# Patient Record
Sex: Male | Born: 1940
Health system: Southern US, Community
[De-identification: ages and names within clinical notes are randomized; demographics above are authoritative.]

## PROBLEM LIST (undated history)

## (undated) DIAGNOSIS — I10 Essential (primary) hypertension: Secondary | ICD-10-CM

## (undated) HISTORY — PX: KNEE ARTHROSCOPY: SUR90

## (undated) HISTORY — PX: SHOULDER ARTHROSCOPY: SHX128

---

## 1997-07-22 ENCOUNTER — Ambulatory Visit (HOSPITAL_COMMUNITY): Admission: RE | Admit: 1997-07-22 | Discharge: 1997-07-22 | Payer: Self-pay | Admitting: Cardiology

## 1997-08-28 ENCOUNTER — Ambulatory Visit (HOSPITAL_COMMUNITY): Admission: RE | Admit: 1997-08-28 | Discharge: 1997-08-28 | Payer: Self-pay | Admitting: Gastroenterology

## 1997-09-01 ENCOUNTER — Ambulatory Visit (HOSPITAL_BASED_OUTPATIENT_CLINIC_OR_DEPARTMENT_OTHER): Admission: RE | Admit: 1997-09-01 | Discharge: 1997-09-01 | Payer: Self-pay | Admitting: Urology

## 1998-10-27 ENCOUNTER — Emergency Department (HOSPITAL_COMMUNITY): Admission: EM | Admit: 1998-10-27 | Discharge: 1998-10-27 | Payer: Self-pay | Admitting: Emergency Medicine

## 1999-03-04 ENCOUNTER — Ambulatory Visit (HOSPITAL_COMMUNITY): Admission: RE | Admit: 1999-03-04 | Discharge: 1999-03-04 | Payer: Self-pay | Admitting: Cardiology

## 1999-03-04 ENCOUNTER — Encounter: Payer: Self-pay | Admitting: Cardiology

## 2002-08-07 ENCOUNTER — Encounter: Payer: Self-pay | Admitting: Emergency Medicine

## 2002-08-07 ENCOUNTER — Inpatient Hospital Stay (HOSPITAL_COMMUNITY): Admission: EM | Admit: 2002-08-07 | Discharge: 2002-08-08 | Payer: Self-pay | Admitting: Emergency Medicine

## 2002-08-13 ENCOUNTER — Ambulatory Visit (HOSPITAL_COMMUNITY): Admission: RE | Admit: 2002-08-13 | Discharge: 2002-08-13 | Payer: Self-pay | Admitting: Cardiology

## 2002-08-13 ENCOUNTER — Encounter: Payer: Self-pay | Admitting: Cardiology

## 2003-03-04 ENCOUNTER — Encounter: Admission: RE | Admit: 2003-03-04 | Discharge: 2003-03-04 | Payer: Self-pay | Admitting: Cardiology

## 2003-04-06 ENCOUNTER — Encounter: Admission: RE | Admit: 2003-04-06 | Discharge: 2003-04-06 | Payer: Self-pay | Admitting: Cardiology

## 2003-05-16 ENCOUNTER — Encounter: Admission: RE | Admit: 2003-05-16 | Discharge: 2003-05-16 | Payer: Self-pay | Admitting: Infectious Diseases

## 2005-06-14 ENCOUNTER — Emergency Department (HOSPITAL_COMMUNITY): Admission: EM | Admit: 2005-06-14 | Discharge: 2005-06-14 | Payer: Self-pay | Admitting: *Deleted

## 2009-09-03 ENCOUNTER — Encounter: Admission: RE | Admit: 2009-09-03 | Discharge: 2009-09-03 | Payer: Self-pay | Admitting: Cardiology

## 2010-05-05 ENCOUNTER — Other Ambulatory Visit: Payer: Self-pay | Admitting: Orthopedic Surgery

## 2010-05-05 DIAGNOSIS — M25511 Pain in right shoulder: Secondary | ICD-10-CM

## 2010-05-10 ENCOUNTER — Ambulatory Visit
Admission: RE | Admit: 2010-05-10 | Discharge: 2010-05-10 | Disposition: A | Discharge status: Home / Self Care | Payer: Medicare Other | Source: Ambulatory Visit | Attending: Orthopedic Surgery | Admitting: Orthopedic Surgery

## 2010-05-10 DIAGNOSIS — M25511 Pain in right shoulder: Secondary | ICD-10-CM

## 2010-05-21 ENCOUNTER — Encounter (HOSPITAL_BASED_OUTPATIENT_CLINIC_OR_DEPARTMENT_OTHER)
Admission: RE | Admit: 2010-05-21 | Discharge: 2010-05-21 | Disposition: A | Discharge status: Home / Self Care | Payer: Medicare Other | Source: Ambulatory Visit | Attending: Orthopedic Surgery | Admitting: Orthopedic Surgery

## 2010-05-21 DIAGNOSIS — Z01812 Encounter for preprocedural laboratory examination: Secondary | ICD-10-CM | POA: Insufficient documentation

## 2010-05-21 LAB — BASIC METABOLIC PANEL
GFR calc non Af Amer: >60 mL/min (ref >60)
Glucose, Bld: 88 mg/dL (ref 70–99)
Potassium: 4 mEq/L (ref 3.5–5.1)

## 2010-05-25 ENCOUNTER — Ambulatory Visit (HOSPITAL_BASED_OUTPATIENT_CLINIC_OR_DEPARTMENT_OTHER)
Admission: RE | Admit: 2010-05-25 | Discharge: 2010-05-26 | Disposition: A | Discharge status: Home / Self Care | Payer: Medicare Other | Source: Ambulatory Visit | Attending: Orthopedic Surgery | Admitting: Orthopedic Surgery

## 2010-05-25 DIAGNOSIS — I10 Essential (primary) hypertension: Secondary | ICD-10-CM | POA: Insufficient documentation

## 2010-05-25 DIAGNOSIS — M19019 Primary osteoarthritis, unspecified shoulder: Secondary | ICD-10-CM | POA: Insufficient documentation

## 2010-05-25 DIAGNOSIS — Z01812 Encounter for preprocedural laboratory examination: Secondary | ICD-10-CM | POA: Insufficient documentation

## 2010-05-25 DIAGNOSIS — M24119 Other articular cartilage disorders, unspecified shoulder: Secondary | ICD-10-CM | POA: Insufficient documentation

## 2010-05-25 DIAGNOSIS — M719 Bursopathy, unspecified: Secondary | ICD-10-CM | POA: Insufficient documentation

## 2010-05-25 DIAGNOSIS — M25819 Other specified joint disorders, unspecified shoulder: Secondary | ICD-10-CM | POA: Insufficient documentation

## 2010-05-25 DIAGNOSIS — M67919 Unspecified disorder of synovium and tendon, unspecified shoulder: Secondary | ICD-10-CM | POA: Insufficient documentation

## 2010-05-27 NOTE — Op Note (Signed)
NAME:  Theodore Wilson, Theodore Wilson                 ACCOUNT NO.:  [id number]  MEDICAL RECORD NO.:  [id number]          PATIENT TYPE:  LOCATION:                                 FACILITY:  PHYSICIAN:  Elana Alm. Thurston Hole, M.D. DATE OF BIRTH:  Jan 06, 1941  DATE OF PROCEDURE:  05/25/2010 DATE OF DISCHARGE:                              OPERATIVE REPORT   PREOPERATIVE DIAGNOSES: 1. Right shoulder rotator cuff tear. 2. Right shoulder partial labrum tear. 3. Right shoulder impingement. 4. 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  POSTOPERATIVE DIAGNOSES: 1. Right shoulder rotator cuff tear. 2. Right shoulder partial labrum tear. 3. Right shoulder impingement. 4. 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  PROCEDURES: 1. Right shoulder EUA followed by arthroscopically assisted rotator     cuff repair using Arthrex suture anchor x1 plus swivel lock anchor     x1. 2. Right shoulder debridement partial labrum tear. 3. Right shoulder subacromial decompression. 4. Right shoulder distal clavicle excision.  SURGEON:  Elana Alm. Thurston Hole, MD  ASSISTANT:  Julien Girt, PA-C  ANESTHESIA:  General.  OPERATIVE TIME:  1 hour and 15 minutes.  COMPLICATIONS:  None.  INDICATIONS FOR PROCEDURE:  Theodore Wilson is a 70 year old gentleman who has had 2-3 years of increasing right shoulder pain with exam and MRI documenting a rotator cuff tear, partial labrum tear with impingement, and AC joint arthropathy.  He has failed conservative care and is now to undergo arthroscopy and repair.  DESCRIPTION:  Theodore Wilson is brought to the operating room on May 25, 2010, after an interscalene block was placed in the holding room by Anesthesia.  He was placed on the operative table in a supine position. After being placed under general anesthesia, his right shoulder was examined.  He had full range of motion in his shoulder with stable ligamentous exam.  He received Ancef 1 g IV  preoperatively for prophylaxis.  He was then placed in beach-chair position and shoulder and arm was prepped using sterile DuraPrep and draped using sterile technique.  Time-out procedure was called and the correct right shoulder identified.  Initially through a posterior arthroscopic portal, the arthroscope with a pump attached was placed into an anterior portal and arthroscopic probe was placed.  On initial inspection, the articular cartilage in the glenohumeral joint was found to be intact.  He had partial tearing of the anterior and superior labrum 25%, which was debrided.  The anterior-inferior labrum and anterior-inferior glenohumeral ligament complex was intact.  Biceps tendon anchor and biceps tendon was intact.  The rotator cuff showed a complete tear of the supraspinatus and partial tear of the infraspinatus, the anterior 50%, this was debrided arthroscopically.  The rest of the rotator cuff was intact.  Inferior capsular recess was free of pathology. Subacromial space was entered and a lateral arthroscopic portal was made.  Moderately thickened bursitis was resected.  The rotator cuff tear was further debrided arthroscopically.  Impingement was noted.  A subacromial decompression was carried out removing 6 to 8 mm of the undersurface of the anterolateral and anteromedial acromion and CA ligament release carried out  as well.  The [facility] joint showed significant spurring and degenerative changes and the distal 8-10 mm of clavicle was resected with a 6-mm bur.  After this was done, then 2 accessory lateral portals, the rotator cuff tear was repaired primarily with one Arthrex suture anchoring the greater tuberosity.  Each of the sutures in this anchor were passed arthroscopically with a mattress suture technique and then the central portion had a suture link placed through as well and then all portions of these sutures after the mattress sutures were tied down arthroscopically.  The  remaining ends of all these sutures were passed through a swivel lock and then this was placed in the lateral aspect of the greater tuberosity with secondary firm and tight fixation as well.  This completely repaired the rotator cuff tear back down to the greater tuberosity with firm and tight fixation in two different places.  After this was done, the shoulder could be brought through a satisfactory range of motion with no impingement on the repair.  At this point, I felt that all pathology had been satisfactorily addressed.  The instruments were removed.  Portal was closed with 3-0 nylon suture. Sterile dressings and a sling applied and the patient awakened and taken to recovery room in a stable condition.  FOLLOWUP CARE:  Theodore Wilson will be followed overnight at recovery care center for IV pain control, neurovascular monitoring.  He will be discharged tomorrow on Percocet and Robaxin.  Begin early physical therapy.  He will be seen back in the office in a week for sutures out and followup.     Tailyn Hantz A. Thurston Hole, M.D.     RAW/MEDQ  D:  05/25/2010  T:  05/26/2010  Job:  [id number]  Electronically Signed by Salvatore Marvel M.D. on 05/26/2010 02:22:54 PM

## 2010-08-06 NOTE — Discharge Summary (Signed)
   NAME:  Theodore Wilson, Theodore Wilson NO.:  [id number]   MEDICAL RECORD NO.:  [id number]                   PATIENT TYPE:  INP   LOCATION:  2008                                 FACILITY:  [facility]   PHYSICIAN:  Osvaldo Shipper. Spruill, M.D.             DATE OF BIRTH:  [birth year]   DATE OF ADMISSION:  08/07/2002  DATE OF DISCHARGE:  08/08/2002                                 DISCHARGE SUMMARY   DISCHARGE DIAGNOSES:  1. Chest pain.  2. Hypertension.  3. Hyperlipidemia.   HISTORY OF PRESENT ILLNESS:  This is a 70 year old male who presents  initially to the emergency department of [facility] after having  had pain initially in his back and then noted pressure in the left  substernal area radiating down the left arm.  He complained of being weak  and having shortness of breath.   HOSPITAL COURSE:  The patient was admitted to the medical service.  He was  placed on telemetry.  He was seen by pharmacy for Lovenox protocol.  He  received O2 therapy, Toprol XL 50, aspirin therapy, Diovan 160, Ativan 1 mg  three times daily and Protonix 40 mg.  On Aug 08, 2002, the patient's chest  pain was better.  His chest x-ray was normal.  His repeat electrocardiogram  was nonacute.  His CKs were elevated, but the MBs were well within normal  limits.  The troponins were also well within normal limits.  His cholesterol  was normal at 196.  It was therefore the opinion the patient could be  discharged home and aggressive followup done as an outpatient.   FOLLOW UP:  The patient is to be followed in the office this week for  aggressive treatment and continued workup of the chest pain and discomfort.  He will have an outpatient Persantine Cardiolite study on Aug 13, 2002.  He  is to notify the office immediately if any changes, problems or concerns.   DISCHARGE MEDICATIONS:  Resume home medications.       Ivery Quale, P.A.                       Osvaldo Shipper. Spruill, M.D.    HB/MEDQ  D:  08/29/2002  T:  08/30/2002  Job:  [id number]

## 2011-07-04 DIAGNOSIS — R0602 Shortness of breath: Secondary | ICD-10-CM | POA: Diagnosis not present

## 2011-07-04 DIAGNOSIS — E119 Type 2 diabetes mellitus without complications: Secondary | ICD-10-CM | POA: Diagnosis not present

## 2011-07-04 DIAGNOSIS — E559 Vitamin D deficiency, unspecified: Secondary | ICD-10-CM | POA: Diagnosis not present

## 2011-07-08 DIAGNOSIS — E291 Testicular hypofunction: Secondary | ICD-10-CM | POA: Diagnosis not present

## 2011-07-08 DIAGNOSIS — N138 Other obstructive and reflux uropathy: Secondary | ICD-10-CM | POA: Diagnosis not present

## 2011-07-08 DIAGNOSIS — N401 Enlarged prostate with lower urinary tract symptoms: Secondary | ICD-10-CM | POA: Diagnosis not present

## 2011-07-08 DIAGNOSIS — N139 Obstructive and reflux uropathy, unspecified: Secondary | ICD-10-CM | POA: Diagnosis not present

## 2011-07-29 DIAGNOSIS — N139 Obstructive and reflux uropathy, unspecified: Secondary | ICD-10-CM | POA: Diagnosis not present

## 2011-07-29 DIAGNOSIS — N401 Enlarged prostate with lower urinary tract symptoms: Secondary | ICD-10-CM | POA: Diagnosis not present

## 2011-08-10 DIAGNOSIS — M999 Biomechanical lesion, unspecified: Secondary | ICD-10-CM | POA: Diagnosis not present

## 2011-08-10 DIAGNOSIS — M25559 Pain in unspecified hip: Secondary | ICD-10-CM | POA: Diagnosis not present

## 2011-08-10 DIAGNOSIS — M5126 Other intervertebral disc displacement, lumbar region: Secondary | ICD-10-CM | POA: Diagnosis not present

## 2011-11-03 DIAGNOSIS — E291 Testicular hypofunction: Secondary | ICD-10-CM | POA: Diagnosis not present

## 2011-11-03 DIAGNOSIS — R3129 Other microscopic hematuria: Secondary | ICD-10-CM | POA: Diagnosis not present

## 2011-11-03 DIAGNOSIS — R823 Hemoglobinuria: Secondary | ICD-10-CM | POA: Diagnosis not present

## 2011-11-24 DIAGNOSIS — R823 Hemoglobinuria: Secondary | ICD-10-CM | POA: Diagnosis not present

## 2011-11-24 DIAGNOSIS — R3129 Other microscopic hematuria: Secondary | ICD-10-CM | POA: Diagnosis not present

## 2011-12-02 DIAGNOSIS — R3129 Other microscopic hematuria: Secondary | ICD-10-CM | POA: Diagnosis not present

## 2011-12-02 DIAGNOSIS — R319 Hematuria, unspecified: Secondary | ICD-10-CM | POA: Diagnosis not present

## 2012-01-05 DIAGNOSIS — R3129 Other microscopic hematuria: Secondary | ICD-10-CM | POA: Diagnosis not present

## 2012-01-09 DIAGNOSIS — I10 Essential (primary) hypertension: Secondary | ICD-10-CM | POA: Diagnosis not present

## 2012-01-09 DIAGNOSIS — M1990 Unspecified osteoarthritis, unspecified site: Secondary | ICD-10-CM | POA: Diagnosis not present

## 2012-01-09 DIAGNOSIS — R7309 Other abnormal glucose: Secondary | ICD-10-CM | POA: Diagnosis not present

## 2012-01-09 DIAGNOSIS — E119 Type 2 diabetes mellitus without complications: Secondary | ICD-10-CM | POA: Diagnosis not present

## 2012-01-09 DIAGNOSIS — Z23 Encounter for immunization: Secondary | ICD-10-CM | POA: Diagnosis not present

## 2012-01-13 DIAGNOSIS — Z23 Encounter for immunization: Secondary | ICD-10-CM | POA: Diagnosis not present

## 2012-02-20 DIAGNOSIS — E291 Testicular hypofunction: Secondary | ICD-10-CM | POA: Diagnosis not present

## 2012-03-06 DIAGNOSIS — E291 Testicular hypofunction: Secondary | ICD-10-CM | POA: Diagnosis not present

## 2012-03-06 DIAGNOSIS — N139 Obstructive and reflux uropathy, unspecified: Secondary | ICD-10-CM | POA: Diagnosis not present

## 2012-03-06 DIAGNOSIS — N401 Enlarged prostate with lower urinary tract symptoms: Secondary | ICD-10-CM | POA: Diagnosis not present

## 2012-03-06 DIAGNOSIS — T50995A Adverse effect of other drugs, medicaments and biological substances, initial encounter: Secondary | ICD-10-CM | POA: Diagnosis not present

## 2012-03-06 DIAGNOSIS — R358 Other polyuria: Secondary | ICD-10-CM | POA: Diagnosis not present

## 2012-05-01 DIAGNOSIS — E291 Testicular hypofunction: Secondary | ICD-10-CM | POA: Diagnosis not present

## 2012-06-14 DIAGNOSIS — N401 Enlarged prostate with lower urinary tract symptoms: Secondary | ICD-10-CM | POA: Diagnosis not present

## 2012-06-14 DIAGNOSIS — N139 Obstructive and reflux uropathy, unspecified: Secondary | ICD-10-CM | POA: Diagnosis not present

## 2012-06-21 DIAGNOSIS — E291 Testicular hypofunction: Secondary | ICD-10-CM | POA: Diagnosis not present

## 2012-07-03 DIAGNOSIS — E119 Type 2 diabetes mellitus without complications: Secondary | ICD-10-CM | POA: Diagnosis not present

## 2012-07-05 DIAGNOSIS — M79609 Pain in unspecified limb: Secondary | ICD-10-CM | POA: Diagnosis not present

## 2012-07-16 DIAGNOSIS — M5430 Sciatica, unspecified side: Secondary | ICD-10-CM | POA: Diagnosis not present

## 2012-07-19 DIAGNOSIS — E290 Testicular hyperfunction: Secondary | ICD-10-CM | POA: Diagnosis not present

## 2012-07-26 ENCOUNTER — Other Ambulatory Visit: Payer: Self-pay | Admitting: Orthopedic Surgery

## 2012-07-26 ENCOUNTER — Ambulatory Visit
Admission: RE | Admit: 2012-07-26 | Discharge: 2012-07-26 | Disposition: A | Discharge status: Home / Self Care | Payer: Medicare Other | Source: Ambulatory Visit | Attending: Orthopedic Surgery | Admitting: Orthopedic Surgery

## 2012-07-26 DIAGNOSIS — M47817 Spondylosis without myelopathy or radiculopathy, lumbosacral region: Secondary | ICD-10-CM | POA: Diagnosis not present

## 2012-07-26 DIAGNOSIS — M5430 Sciatica, unspecified side: Secondary | ICD-10-CM

## 2012-07-26 DIAGNOSIS — M5137 Other intervertebral disc degeneration, lumbosacral region: Secondary | ICD-10-CM | POA: Diagnosis not present

## 2012-07-30 DIAGNOSIS — M5430 Sciatica, unspecified side: Secondary | ICD-10-CM | POA: Diagnosis not present

## 2012-08-17 DIAGNOSIS — N139 Obstructive and reflux uropathy, unspecified: Secondary | ICD-10-CM | POA: Diagnosis not present

## 2012-08-17 DIAGNOSIS — E291 Testicular hypofunction: Secondary | ICD-10-CM | POA: Diagnosis not present

## 2012-08-17 DIAGNOSIS — N401 Enlarged prostate with lower urinary tract symptoms: Secondary | ICD-10-CM | POA: Diagnosis not present

## 2012-08-20 DIAGNOSIS — M5137 Other intervertebral disc degeneration, lumbosacral region: Secondary | ICD-10-CM | POA: Diagnosis not present

## 2012-09-06 DIAGNOSIS — I10 Essential (primary) hypertension: Secondary | ICD-10-CM | POA: Diagnosis not present

## 2012-09-27 DIAGNOSIS — E291 Testicular hypofunction: Secondary | ICD-10-CM | POA: Diagnosis not present

## 2012-11-02 DIAGNOSIS — E7800 Pure hypercholesterolemia, unspecified: Secondary | ICD-10-CM | POA: Diagnosis not present

## 2012-11-02 DIAGNOSIS — R7309 Other abnormal glucose: Secondary | ICD-10-CM | POA: Diagnosis not present

## 2012-11-02 DIAGNOSIS — I10 Essential (primary) hypertension: Secondary | ICD-10-CM | POA: Diagnosis not present

## 2012-11-29 DIAGNOSIS — E291 Testicular hypofunction: Secondary | ICD-10-CM | POA: Diagnosis not present

## 2012-11-29 DIAGNOSIS — N401 Enlarged prostate with lower urinary tract symptoms: Secondary | ICD-10-CM | POA: Diagnosis not present

## 2012-11-29 DIAGNOSIS — N139 Obstructive and reflux uropathy, unspecified: Secondary | ICD-10-CM | POA: Diagnosis not present

## 2012-12-24 DIAGNOSIS — G5780 Other specified mononeuropathies of unspecified lower limb: Secondary | ICD-10-CM | POA: Diagnosis not present

## 2013-02-19 DIAGNOSIS — N401 Enlarged prostate with lower urinary tract symptoms: Secondary | ICD-10-CM | POA: Diagnosis not present

## 2013-02-19 DIAGNOSIS — N139 Obstructive and reflux uropathy, unspecified: Secondary | ICD-10-CM | POA: Diagnosis not present

## 2013-02-19 DIAGNOSIS — E291 Testicular hypofunction: Secondary | ICD-10-CM | POA: Diagnosis not present

## 2013-02-25 DIAGNOSIS — M5137 Other intervertebral disc degeneration, lumbosacral region: Secondary | ICD-10-CM | POA: Diagnosis not present

## 2013-03-04 DIAGNOSIS — I10 Essential (primary) hypertension: Secondary | ICD-10-CM | POA: Diagnosis not present

## 2013-03-26 DIAGNOSIS — N139 Obstructive and reflux uropathy, unspecified: Secondary | ICD-10-CM | POA: Diagnosis not present

## 2013-03-26 DIAGNOSIS — R358 Other polyuria: Secondary | ICD-10-CM | POA: Diagnosis not present

## 2013-03-26 DIAGNOSIS — N401 Enlarged prostate with lower urinary tract symptoms: Secondary | ICD-10-CM | POA: Diagnosis not present

## 2013-03-26 DIAGNOSIS — T50995A Adverse effect of other drugs, medicaments and biological substances, initial encounter: Secondary | ICD-10-CM | POA: Diagnosis not present

## 2013-03-26 DIAGNOSIS — R3589 Other polyuria: Secondary | ICD-10-CM | POA: Diagnosis not present

## 2013-03-26 DIAGNOSIS — N138 Other obstructive and reflux uropathy: Secondary | ICD-10-CM | POA: Diagnosis not present

## 2013-03-26 DIAGNOSIS — E291 Testicular hypofunction: Secondary | ICD-10-CM | POA: Diagnosis not present

## 2013-04-29 DIAGNOSIS — M5137 Other intervertebral disc degeneration, lumbosacral region: Secondary | ICD-10-CM | POA: Diagnosis not present

## 2013-05-21 DIAGNOSIS — N401 Enlarged prostate with lower urinary tract symptoms: Secondary | ICD-10-CM | POA: Diagnosis not present

## 2013-05-21 DIAGNOSIS — T50995A Adverse effect of other drugs, medicaments and biological substances, initial encounter: Secondary | ICD-10-CM | POA: Diagnosis not present

## 2013-05-21 DIAGNOSIS — E291 Testicular hypofunction: Secondary | ICD-10-CM | POA: Diagnosis not present

## 2013-05-21 DIAGNOSIS — R358 Other polyuria: Secondary | ICD-10-CM | POA: Diagnosis not present

## 2013-05-21 DIAGNOSIS — N138 Other obstructive and reflux uropathy: Secondary | ICD-10-CM | POA: Diagnosis not present

## 2013-05-21 DIAGNOSIS — R3589 Other polyuria: Secondary | ICD-10-CM | POA: Diagnosis not present

## 2013-05-21 DIAGNOSIS — N139 Obstructive and reflux uropathy, unspecified: Secondary | ICD-10-CM | POA: Diagnosis not present

## 2013-07-01 DIAGNOSIS — E119 Type 2 diabetes mellitus without complications: Secondary | ICD-10-CM | POA: Diagnosis not present

## 2013-07-02 DIAGNOSIS — I10 Essential (primary) hypertension: Secondary | ICD-10-CM | POA: Diagnosis not present

## 2013-07-02 DIAGNOSIS — R7309 Other abnormal glucose: Secondary | ICD-10-CM | POA: Diagnosis not present

## 2013-07-23 DIAGNOSIS — R358 Other polyuria: Secondary | ICD-10-CM | POA: Diagnosis not present

## 2013-07-23 DIAGNOSIS — N138 Other obstructive and reflux uropathy: Secondary | ICD-10-CM | POA: Diagnosis not present

## 2013-07-23 DIAGNOSIS — T50995A Adverse effect of other drugs, medicaments and biological substances, initial encounter: Secondary | ICD-10-CM | POA: Diagnosis not present

## 2013-07-23 DIAGNOSIS — N139 Obstructive and reflux uropathy, unspecified: Secondary | ICD-10-CM | POA: Diagnosis not present

## 2013-07-23 DIAGNOSIS — R3589 Other polyuria: Secondary | ICD-10-CM | POA: Diagnosis not present

## 2013-07-23 DIAGNOSIS — E291 Testicular hypofunction: Secondary | ICD-10-CM | POA: Diagnosis not present

## 2013-07-23 DIAGNOSIS — N401 Enlarged prostate with lower urinary tract symptoms: Secondary | ICD-10-CM | POA: Diagnosis not present

## 2013-07-23 DIAGNOSIS — N486 Induration penis plastica: Secondary | ICD-10-CM | POA: Diagnosis not present

## 2013-09-05 DIAGNOSIS — N139 Obstructive and reflux uropathy, unspecified: Secondary | ICD-10-CM | POA: Diagnosis not present

## 2013-09-05 DIAGNOSIS — N138 Other obstructive and reflux uropathy: Secondary | ICD-10-CM | POA: Diagnosis not present

## 2013-09-05 DIAGNOSIS — E291 Testicular hypofunction: Secondary | ICD-10-CM | POA: Diagnosis not present

## 2013-09-05 DIAGNOSIS — N401 Enlarged prostate with lower urinary tract symptoms: Secondary | ICD-10-CM | POA: Diagnosis not present

## 2013-09-16 DIAGNOSIS — M5137 Other intervertebral disc degeneration, lumbosacral region: Secondary | ICD-10-CM | POA: Diagnosis not present

## 2013-09-20 DIAGNOSIS — H8309 Labyrinthitis, unspecified ear: Secondary | ICD-10-CM | POA: Diagnosis not present

## 2013-09-20 DIAGNOSIS — I10 Essential (primary) hypertension: Secondary | ICD-10-CM | POA: Diagnosis not present

## 2013-09-20 DIAGNOSIS — E119 Type 2 diabetes mellitus without complications: Secondary | ICD-10-CM | POA: Diagnosis not present

## 2013-10-07 DIAGNOSIS — H8309 Labyrinthitis, unspecified ear: Secondary | ICD-10-CM | POA: Diagnosis not present

## 2013-10-07 DIAGNOSIS — I10 Essential (primary) hypertension: Secondary | ICD-10-CM | POA: Diagnosis not present

## 2013-10-10 DIAGNOSIS — R358 Other polyuria: Secondary | ICD-10-CM | POA: Diagnosis not present

## 2013-10-10 DIAGNOSIS — T50995A Adverse effect of other drugs, medicaments and biological substances, initial encounter: Secondary | ICD-10-CM | POA: Diagnosis not present

## 2013-10-10 DIAGNOSIS — N401 Enlarged prostate with lower urinary tract symptoms: Secondary | ICD-10-CM | POA: Diagnosis not present

## 2013-10-10 DIAGNOSIS — N139 Obstructive and reflux uropathy, unspecified: Secondary | ICD-10-CM | POA: Diagnosis not present

## 2013-10-10 DIAGNOSIS — R3589 Other polyuria: Secondary | ICD-10-CM | POA: Diagnosis not present

## 2013-10-10 DIAGNOSIS — E291 Testicular hypofunction: Secondary | ICD-10-CM | POA: Diagnosis not present

## 2013-11-13 DIAGNOSIS — M545 Low back pain, unspecified: Secondary | ICD-10-CM | POA: Diagnosis not present

## 2013-11-13 DIAGNOSIS — IMO0001 Reserved for inherently not codable concepts without codable children: Secondary | ICD-10-CM | POA: Diagnosis not present

## 2013-11-13 DIAGNOSIS — M25519 Pain in unspecified shoulder: Secondary | ICD-10-CM | POA: Diagnosis not present

## 2013-11-13 DIAGNOSIS — M4310 Spondylolisthesis, site unspecified: Secondary | ICD-10-CM | POA: Diagnosis not present

## 2013-11-13 DIAGNOSIS — M25569 Pain in unspecified knee: Secondary | ICD-10-CM | POA: Diagnosis not present

## 2013-11-13 DIAGNOSIS — IMO0002 Reserved for concepts with insufficient information to code with codable children: Secondary | ICD-10-CM | POA: Diagnosis not present

## 2013-11-13 DIAGNOSIS — M1710 Unilateral primary osteoarthritis, unspecified knee: Secondary | ICD-10-CM | POA: Diagnosis not present

## 2013-12-03 DIAGNOSIS — M4310 Spondylolisthesis, site unspecified: Secondary | ICD-10-CM | POA: Diagnosis not present

## 2013-12-03 DIAGNOSIS — M159 Polyosteoarthritis, unspecified: Secondary | ICD-10-CM | POA: Diagnosis not present

## 2013-12-03 DIAGNOSIS — M25519 Pain in unspecified shoulder: Secondary | ICD-10-CM | POA: Diagnosis not present

## 2013-12-03 DIAGNOSIS — M4600 Spinal enthesopathy, site unspecified: Secondary | ICD-10-CM | POA: Diagnosis not present

## 2013-12-03 DIAGNOSIS — M76899 Other specified enthesopathies of unspecified lower limb, excluding foot: Secondary | ICD-10-CM | POA: Diagnosis not present

## 2013-12-03 DIAGNOSIS — M545 Low back pain, unspecified: Secondary | ICD-10-CM | POA: Diagnosis not present

## 2013-12-03 DIAGNOSIS — IMO0002 Reserved for concepts with insufficient information to code with codable children: Secondary | ICD-10-CM | POA: Diagnosis not present

## 2013-12-03 DIAGNOSIS — M25569 Pain in unspecified knee: Secondary | ICD-10-CM | POA: Diagnosis not present

## 2013-12-03 DIAGNOSIS — IMO0001 Reserved for inherently not codable concepts without codable children: Secondary | ICD-10-CM | POA: Diagnosis not present

## 2013-12-03 DIAGNOSIS — M999 Biomechanical lesion, unspecified: Secondary | ICD-10-CM | POA: Diagnosis not present

## 2013-12-03 DIAGNOSIS — M1710 Unilateral primary osteoarthritis, unspecified knee: Secondary | ICD-10-CM | POA: Diagnosis not present

## 2013-12-03 DIAGNOSIS — M5137 Other intervertebral disc degeneration, lumbosacral region: Secondary | ICD-10-CM | POA: Diagnosis not present

## 2013-12-04 DIAGNOSIS — IMO0001 Reserved for inherently not codable concepts without codable children: Secondary | ICD-10-CM | POA: Diagnosis not present

## 2013-12-04 DIAGNOSIS — M545 Low back pain, unspecified: Secondary | ICD-10-CM | POA: Diagnosis not present

## 2013-12-04 DIAGNOSIS — M159 Polyosteoarthritis, unspecified: Secondary | ICD-10-CM | POA: Diagnosis not present

## 2013-12-04 DIAGNOSIS — M25519 Pain in unspecified shoulder: Secondary | ICD-10-CM | POA: Diagnosis not present

## 2013-12-04 DIAGNOSIS — M999 Biomechanical lesion, unspecified: Secondary | ICD-10-CM | POA: Diagnosis not present

## 2013-12-04 DIAGNOSIS — M76899 Other specified enthesopathies of unspecified lower limb, excluding foot: Secondary | ICD-10-CM | POA: Diagnosis not present

## 2013-12-04 DIAGNOSIS — M4600 Spinal enthesopathy, site unspecified: Secondary | ICD-10-CM | POA: Diagnosis not present

## 2013-12-04 DIAGNOSIS — M4310 Spondylolisthesis, site unspecified: Secondary | ICD-10-CM | POA: Diagnosis not present

## 2013-12-04 DIAGNOSIS — M1710 Unilateral primary osteoarthritis, unspecified knee: Secondary | ICD-10-CM | POA: Diagnosis not present

## 2013-12-04 DIAGNOSIS — M5137 Other intervertebral disc degeneration, lumbosacral region: Secondary | ICD-10-CM | POA: Diagnosis not present

## 2013-12-04 DIAGNOSIS — M25569 Pain in unspecified knee: Secondary | ICD-10-CM | POA: Diagnosis not present

## 2013-12-04 DIAGNOSIS — IMO0002 Reserved for concepts with insufficient information to code with codable children: Secondary | ICD-10-CM | POA: Diagnosis not present

## 2013-12-05 DIAGNOSIS — M159 Polyosteoarthritis, unspecified: Secondary | ICD-10-CM | POA: Diagnosis not present

## 2013-12-05 DIAGNOSIS — M25519 Pain in unspecified shoulder: Secondary | ICD-10-CM | POA: Diagnosis not present

## 2013-12-05 DIAGNOSIS — M4600 Spinal enthesopathy, site unspecified: Secondary | ICD-10-CM | POA: Diagnosis not present

## 2013-12-05 DIAGNOSIS — M545 Low back pain, unspecified: Secondary | ICD-10-CM | POA: Diagnosis not present

## 2013-12-05 DIAGNOSIS — M25569 Pain in unspecified knee: Secondary | ICD-10-CM | POA: Diagnosis not present

## 2013-12-05 DIAGNOSIS — M999 Biomechanical lesion, unspecified: Secondary | ICD-10-CM | POA: Diagnosis not present

## 2013-12-05 DIAGNOSIS — IMO0002 Reserved for concepts with insufficient information to code with codable children: Secondary | ICD-10-CM | POA: Diagnosis not present

## 2013-12-05 DIAGNOSIS — M76899 Other specified enthesopathies of unspecified lower limb, excluding foot: Secondary | ICD-10-CM | POA: Diagnosis not present

## 2013-12-05 DIAGNOSIS — IMO0001 Reserved for inherently not codable concepts without codable children: Secondary | ICD-10-CM | POA: Diagnosis not present

## 2013-12-05 DIAGNOSIS — M1710 Unilateral primary osteoarthritis, unspecified knee: Secondary | ICD-10-CM | POA: Diagnosis not present

## 2013-12-05 DIAGNOSIS — M5137 Other intervertebral disc degeneration, lumbosacral region: Secondary | ICD-10-CM | POA: Diagnosis not present

## 2013-12-05 DIAGNOSIS — M4310 Spondylolisthesis, site unspecified: Secondary | ICD-10-CM | POA: Diagnosis not present

## 2013-12-10 DIAGNOSIS — M545 Low back pain, unspecified: Secondary | ICD-10-CM | POA: Diagnosis not present

## 2013-12-10 DIAGNOSIS — IMO0001 Reserved for inherently not codable concepts without codable children: Secondary | ICD-10-CM | POA: Diagnosis not present

## 2013-12-10 DIAGNOSIS — M999 Biomechanical lesion, unspecified: Secondary | ICD-10-CM | POA: Diagnosis not present

## 2013-12-10 DIAGNOSIS — M76899 Other specified enthesopathies of unspecified lower limb, excluding foot: Secondary | ICD-10-CM | POA: Diagnosis not present

## 2013-12-10 DIAGNOSIS — M25519 Pain in unspecified shoulder: Secondary | ICD-10-CM | POA: Diagnosis not present

## 2013-12-10 DIAGNOSIS — M4600 Spinal enthesopathy, site unspecified: Secondary | ICD-10-CM | POA: Diagnosis not present

## 2013-12-10 DIAGNOSIS — IMO0002 Reserved for concepts with insufficient information to code with codable children: Secondary | ICD-10-CM | POA: Diagnosis not present

## 2013-12-10 DIAGNOSIS — M159 Polyosteoarthritis, unspecified: Secondary | ICD-10-CM | POA: Diagnosis not present

## 2013-12-10 DIAGNOSIS — M4310 Spondylolisthesis, site unspecified: Secondary | ICD-10-CM | POA: Diagnosis not present

## 2013-12-10 DIAGNOSIS — M5137 Other intervertebral disc degeneration, lumbosacral region: Secondary | ICD-10-CM | POA: Diagnosis not present

## 2013-12-10 DIAGNOSIS — M25569 Pain in unspecified knee: Secondary | ICD-10-CM | POA: Diagnosis not present

## 2013-12-10 DIAGNOSIS — M1710 Unilateral primary osteoarthritis, unspecified knee: Secondary | ICD-10-CM | POA: Diagnosis not present

## 2013-12-11 DIAGNOSIS — M159 Polyosteoarthritis, unspecified: Secondary | ICD-10-CM | POA: Diagnosis not present

## 2013-12-11 DIAGNOSIS — M545 Low back pain, unspecified: Secondary | ICD-10-CM | POA: Diagnosis not present

## 2013-12-11 DIAGNOSIS — M4600 Spinal enthesopathy, site unspecified: Secondary | ICD-10-CM | POA: Diagnosis not present

## 2013-12-11 DIAGNOSIS — M76899 Other specified enthesopathies of unspecified lower limb, excluding foot: Secondary | ICD-10-CM | POA: Diagnosis not present

## 2013-12-11 DIAGNOSIS — M1710 Unilateral primary osteoarthritis, unspecified knee: Secondary | ICD-10-CM | POA: Diagnosis not present

## 2013-12-11 DIAGNOSIS — M25569 Pain in unspecified knee: Secondary | ICD-10-CM | POA: Diagnosis not present

## 2013-12-11 DIAGNOSIS — M4310 Spondylolisthesis, site unspecified: Secondary | ICD-10-CM | POA: Diagnosis not present

## 2013-12-11 DIAGNOSIS — M25519 Pain in unspecified shoulder: Secondary | ICD-10-CM | POA: Diagnosis not present

## 2013-12-11 DIAGNOSIS — IMO0001 Reserved for inherently not codable concepts without codable children: Secondary | ICD-10-CM | POA: Diagnosis not present

## 2013-12-11 DIAGNOSIS — IMO0002 Reserved for concepts with insufficient information to code with codable children: Secondary | ICD-10-CM | POA: Diagnosis not present

## 2013-12-12 DIAGNOSIS — M545 Low back pain, unspecified: Secondary | ICD-10-CM | POA: Diagnosis not present

## 2013-12-12 DIAGNOSIS — IMO0002 Reserved for concepts with insufficient information to code with codable children: Secondary | ICD-10-CM | POA: Diagnosis not present

## 2013-12-12 DIAGNOSIS — M25569 Pain in unspecified knee: Secondary | ICD-10-CM | POA: Diagnosis not present

## 2013-12-12 DIAGNOSIS — M1710 Unilateral primary osteoarthritis, unspecified knee: Secondary | ICD-10-CM | POA: Diagnosis not present

## 2013-12-12 DIAGNOSIS — M159 Polyosteoarthritis, unspecified: Secondary | ICD-10-CM | POA: Diagnosis not present

## 2013-12-12 DIAGNOSIS — M25519 Pain in unspecified shoulder: Secondary | ICD-10-CM | POA: Diagnosis not present

## 2013-12-12 DIAGNOSIS — M76899 Other specified enthesopathies of unspecified lower limb, excluding foot: Secondary | ICD-10-CM | POA: Diagnosis not present

## 2013-12-12 DIAGNOSIS — M4310 Spondylolisthesis, site unspecified: Secondary | ICD-10-CM | POA: Diagnosis not present

## 2013-12-12 DIAGNOSIS — M999 Biomechanical lesion, unspecified: Secondary | ICD-10-CM | POA: Diagnosis not present

## 2013-12-12 DIAGNOSIS — IMO0001 Reserved for inherently not codable concepts without codable children: Secondary | ICD-10-CM | POA: Diagnosis not present

## 2013-12-12 DIAGNOSIS — M5137 Other intervertebral disc degeneration, lumbosacral region: Secondary | ICD-10-CM | POA: Diagnosis not present

## 2013-12-12 DIAGNOSIS — M4600 Spinal enthesopathy, site unspecified: Secondary | ICD-10-CM | POA: Diagnosis not present

## 2013-12-17 DIAGNOSIS — IMO0001 Reserved for inherently not codable concepts without codable children: Secondary | ICD-10-CM | POA: Diagnosis not present

## 2013-12-17 DIAGNOSIS — M5137 Other intervertebral disc degeneration, lumbosacral region: Secondary | ICD-10-CM | POA: Diagnosis not present

## 2013-12-17 DIAGNOSIS — M4310 Spondylolisthesis, site unspecified: Secondary | ICD-10-CM | POA: Diagnosis not present

## 2013-12-17 DIAGNOSIS — IMO0002 Reserved for concepts with insufficient information to code with codable children: Secondary | ICD-10-CM | POA: Diagnosis not present

## 2013-12-17 DIAGNOSIS — M1710 Unilateral primary osteoarthritis, unspecified knee: Secondary | ICD-10-CM | POA: Diagnosis not present

## 2013-12-17 DIAGNOSIS — M25569 Pain in unspecified knee: Secondary | ICD-10-CM | POA: Diagnosis not present

## 2013-12-17 DIAGNOSIS — M999 Biomechanical lesion, unspecified: Secondary | ICD-10-CM | POA: Diagnosis not present

## 2013-12-17 DIAGNOSIS — M25519 Pain in unspecified shoulder: Secondary | ICD-10-CM | POA: Diagnosis not present

## 2013-12-18 DIAGNOSIS — M1710 Unilateral primary osteoarthritis, unspecified knee: Secondary | ICD-10-CM | POA: Diagnosis not present

## 2013-12-18 DIAGNOSIS — M545 Low back pain, unspecified: Secondary | ICD-10-CM | POA: Diagnosis not present

## 2013-12-18 DIAGNOSIS — M4310 Spondylolisthesis, site unspecified: Secondary | ICD-10-CM | POA: Diagnosis not present

## 2013-12-18 DIAGNOSIS — IMO0001 Reserved for inherently not codable concepts without codable children: Secondary | ICD-10-CM | POA: Diagnosis not present

## 2013-12-18 DIAGNOSIS — M76899 Other specified enthesopathies of unspecified lower limb, excluding foot: Secondary | ICD-10-CM | POA: Diagnosis not present

## 2013-12-18 DIAGNOSIS — M999 Biomechanical lesion, unspecified: Secondary | ICD-10-CM | POA: Diagnosis not present

## 2013-12-18 DIAGNOSIS — M25519 Pain in unspecified shoulder: Secondary | ICD-10-CM | POA: Diagnosis not present

## 2013-12-18 DIAGNOSIS — M5137 Other intervertebral disc degeneration, lumbosacral region: Secondary | ICD-10-CM | POA: Diagnosis not present

## 2013-12-18 DIAGNOSIS — IMO0002 Reserved for concepts with insufficient information to code with codable children: Secondary | ICD-10-CM | POA: Diagnosis not present

## 2013-12-18 DIAGNOSIS — M4600 Spinal enthesopathy, site unspecified: Secondary | ICD-10-CM | POA: Diagnosis not present

## 2013-12-19 DIAGNOSIS — M9903 Segmental and somatic dysfunction of lumbar region: Secondary | ICD-10-CM | POA: Diagnosis not present

## 2013-12-19 DIAGNOSIS — M25512 Pain in left shoulder: Secondary | ICD-10-CM | POA: Diagnosis not present

## 2013-12-19 DIAGNOSIS — M25562 Pain in left knee: Secondary | ICD-10-CM | POA: Diagnosis not present

## 2013-12-19 DIAGNOSIS — M170 Bilateral primary osteoarthritis of knee: Secondary | ICD-10-CM | POA: Diagnosis not present

## 2013-12-19 DIAGNOSIS — M25561 Pain in right knee: Secondary | ICD-10-CM | POA: Diagnosis not present

## 2013-12-19 DIAGNOSIS — M9904 Segmental and somatic dysfunction of sacral region: Secondary | ICD-10-CM | POA: Diagnosis not present

## 2013-12-19 DIAGNOSIS — M19012 Primary osteoarthritis, left shoulder: Secondary | ICD-10-CM | POA: Diagnosis not present

## 2013-12-19 DIAGNOSIS — M4316 Spondylolisthesis, lumbar region: Secondary | ICD-10-CM | POA: Diagnosis not present

## 2013-12-19 DIAGNOSIS — M5442 Lumbago with sciatica, left side: Secondary | ICD-10-CM | POA: Diagnosis not present

## 2013-12-24 DIAGNOSIS — M25562 Pain in left knee: Secondary | ICD-10-CM | POA: Diagnosis not present

## 2013-12-24 DIAGNOSIS — M5386 Other specified dorsopathies, lumbar region: Secondary | ICD-10-CM | POA: Diagnosis not present

## 2013-12-24 DIAGNOSIS — M4316 Spondylolisthesis, lumbar region: Secondary | ICD-10-CM | POA: Diagnosis not present

## 2013-12-24 DIAGNOSIS — M4606 Spinal enthesopathy, lumbar region: Secondary | ICD-10-CM | POA: Diagnosis not present

## 2013-12-24 DIAGNOSIS — M9903 Segmental and somatic dysfunction of lumbar region: Secondary | ICD-10-CM | POA: Diagnosis not present

## 2013-12-24 DIAGNOSIS — M25561 Pain in right knee: Secondary | ICD-10-CM | POA: Diagnosis not present

## 2013-12-24 DIAGNOSIS — M5442 Lumbago with sciatica, left side: Secondary | ICD-10-CM | POA: Diagnosis not present

## 2013-12-24 DIAGNOSIS — M9904 Segmental and somatic dysfunction of sacral region: Secondary | ICD-10-CM | POA: Diagnosis not present

## 2013-12-24 DIAGNOSIS — M170 Bilateral primary osteoarthritis of knee: Secondary | ICD-10-CM | POA: Diagnosis not present

## 2013-12-25 DIAGNOSIS — M5442 Lumbago with sciatica, left side: Secondary | ICD-10-CM | POA: Diagnosis not present

## 2013-12-25 DIAGNOSIS — M9904 Segmental and somatic dysfunction of sacral region: Secondary | ICD-10-CM | POA: Diagnosis not present

## 2013-12-25 DIAGNOSIS — M9903 Segmental and somatic dysfunction of lumbar region: Secondary | ICD-10-CM | POA: Diagnosis not present

## 2013-12-25 DIAGNOSIS — M170 Bilateral primary osteoarthritis of knee: Secondary | ICD-10-CM | POA: Diagnosis not present

## 2013-12-25 DIAGNOSIS — M25562 Pain in left knee: Secondary | ICD-10-CM | POA: Diagnosis not present

## 2013-12-25 DIAGNOSIS — M4316 Spondylolisthesis, lumbar region: Secondary | ICD-10-CM | POA: Diagnosis not present

## 2013-12-25 DIAGNOSIS — M25561 Pain in right knee: Secondary | ICD-10-CM | POA: Diagnosis not present

## 2013-12-25 DIAGNOSIS — M1711 Unilateral primary osteoarthritis, right knee: Secondary | ICD-10-CM | POA: Diagnosis not present

## 2013-12-26 DIAGNOSIS — M5386 Other specified dorsopathies, lumbar region: Secondary | ICD-10-CM | POA: Diagnosis not present

## 2013-12-26 DIAGNOSIS — M9903 Segmental and somatic dysfunction of lumbar region: Secondary | ICD-10-CM | POA: Diagnosis not present

## 2013-12-26 DIAGNOSIS — M4316 Spondylolisthesis, lumbar region: Secondary | ICD-10-CM | POA: Diagnosis not present

## 2013-12-26 DIAGNOSIS — M4606 Spinal enthesopathy, lumbar region: Secondary | ICD-10-CM | POA: Diagnosis not present

## 2013-12-26 DIAGNOSIS — M25562 Pain in left knee: Secondary | ICD-10-CM | POA: Diagnosis not present

## 2013-12-26 DIAGNOSIS — M25561 Pain in right knee: Secondary | ICD-10-CM | POA: Diagnosis not present

## 2013-12-26 DIAGNOSIS — M5442 Lumbago with sciatica, left side: Secondary | ICD-10-CM | POA: Diagnosis not present

## 2013-12-26 DIAGNOSIS — M170 Bilateral primary osteoarthritis of knee: Secondary | ICD-10-CM | POA: Diagnosis not present

## 2013-12-26 DIAGNOSIS — M9904 Segmental and somatic dysfunction of sacral region: Secondary | ICD-10-CM | POA: Diagnosis not present

## 2013-12-31 DIAGNOSIS — M170 Bilateral primary osteoarthritis of knee: Secondary | ICD-10-CM | POA: Diagnosis not present

## 2013-12-31 DIAGNOSIS — M9904 Segmental and somatic dysfunction of sacral region: Secondary | ICD-10-CM | POA: Diagnosis not present

## 2013-12-31 DIAGNOSIS — M5386 Other specified dorsopathies, lumbar region: Secondary | ICD-10-CM | POA: Diagnosis not present

## 2013-12-31 DIAGNOSIS — M4316 Spondylolisthesis, lumbar region: Secondary | ICD-10-CM | POA: Diagnosis not present

## 2013-12-31 DIAGNOSIS — M25562 Pain in left knee: Secondary | ICD-10-CM | POA: Diagnosis not present

## 2013-12-31 DIAGNOSIS — M5442 Lumbago with sciatica, left side: Secondary | ICD-10-CM | POA: Diagnosis not present

## 2013-12-31 DIAGNOSIS — M4606 Spinal enthesopathy, lumbar region: Secondary | ICD-10-CM | POA: Diagnosis not present

## 2013-12-31 DIAGNOSIS — M25561 Pain in right knee: Secondary | ICD-10-CM | POA: Diagnosis not present

## 2013-12-31 DIAGNOSIS — M9903 Segmental and somatic dysfunction of lumbar region: Secondary | ICD-10-CM | POA: Diagnosis not present

## 2014-01-06 DIAGNOSIS — Z0000 Encounter for general adult medical examination without abnormal findings: Secondary | ICD-10-CM | POA: Diagnosis not present

## 2014-01-13 DIAGNOSIS — T387X5D Adverse effect of androgens and anabolic congeners, subsequent encounter: Secondary | ICD-10-CM | POA: Diagnosis not present

## 2014-01-13 DIAGNOSIS — N401 Enlarged prostate with lower urinary tract symptoms: Secondary | ICD-10-CM | POA: Diagnosis not present

## 2014-01-13 DIAGNOSIS — N358 Other urethral stricture: Secondary | ICD-10-CM | POA: Diagnosis not present

## 2014-01-13 DIAGNOSIS — N138 Other obstructive and reflux uropathy: Secondary | ICD-10-CM | POA: Diagnosis not present

## 2014-01-13 DIAGNOSIS — E291 Testicular hypofunction: Secondary | ICD-10-CM | POA: Diagnosis not present

## 2014-01-14 DIAGNOSIS — M9904 Segmental and somatic dysfunction of sacral region: Secondary | ICD-10-CM | POA: Diagnosis not present

## 2014-01-14 DIAGNOSIS — M4316 Spondylolisthesis, lumbar region: Secondary | ICD-10-CM | POA: Diagnosis not present

## 2014-01-14 DIAGNOSIS — M9903 Segmental and somatic dysfunction of lumbar region: Secondary | ICD-10-CM | POA: Diagnosis not present

## 2014-01-14 DIAGNOSIS — M1712 Unilateral primary osteoarthritis, left knee: Secondary | ICD-10-CM | POA: Diagnosis not present

## 2014-01-14 DIAGNOSIS — M170 Bilateral primary osteoarthritis of knee: Secondary | ICD-10-CM | POA: Diagnosis not present

## 2014-01-14 DIAGNOSIS — M25561 Pain in right knee: Secondary | ICD-10-CM | POA: Diagnosis not present

## 2014-01-14 DIAGNOSIS — M25562 Pain in left knee: Secondary | ICD-10-CM | POA: Diagnosis not present

## 2014-01-14 DIAGNOSIS — M5442 Lumbago with sciatica, left side: Secondary | ICD-10-CM | POA: Diagnosis not present

## 2014-01-16 DIAGNOSIS — M170 Bilateral primary osteoarthritis of knee: Secondary | ICD-10-CM | POA: Diagnosis not present

## 2014-01-16 DIAGNOSIS — M4606 Spinal enthesopathy, lumbar region: Secondary | ICD-10-CM | POA: Diagnosis not present

## 2014-01-16 DIAGNOSIS — M5386 Other specified dorsopathies, lumbar region: Secondary | ICD-10-CM | POA: Diagnosis not present

## 2014-01-16 DIAGNOSIS — M25561 Pain in right knee: Secondary | ICD-10-CM | POA: Diagnosis not present

## 2014-01-16 DIAGNOSIS — M5442 Lumbago with sciatica, left side: Secondary | ICD-10-CM | POA: Diagnosis not present

## 2014-01-16 DIAGNOSIS — M9904 Segmental and somatic dysfunction of sacral region: Secondary | ICD-10-CM | POA: Diagnosis not present

## 2014-01-16 DIAGNOSIS — M25562 Pain in left knee: Secondary | ICD-10-CM | POA: Diagnosis not present

## 2014-01-16 DIAGNOSIS — M4316 Spondylolisthesis, lumbar region: Secondary | ICD-10-CM | POA: Diagnosis not present

## 2014-01-16 DIAGNOSIS — M9903 Segmental and somatic dysfunction of lumbar region: Secondary | ICD-10-CM | POA: Diagnosis not present

## 2014-01-20 DIAGNOSIS — M25561 Pain in right knee: Secondary | ICD-10-CM | POA: Diagnosis not present

## 2014-01-20 DIAGNOSIS — M9903 Segmental and somatic dysfunction of lumbar region: Secondary | ICD-10-CM | POA: Diagnosis not present

## 2014-01-20 DIAGNOSIS — M170 Bilateral primary osteoarthritis of knee: Secondary | ICD-10-CM | POA: Diagnosis not present

## 2014-01-20 DIAGNOSIS — M5386 Other specified dorsopathies, lumbar region: Secondary | ICD-10-CM | POA: Diagnosis not present

## 2014-01-20 DIAGNOSIS — M4316 Spondylolisthesis, lumbar region: Secondary | ICD-10-CM | POA: Diagnosis not present

## 2014-01-20 DIAGNOSIS — M9904 Segmental and somatic dysfunction of sacral region: Secondary | ICD-10-CM | POA: Diagnosis not present

## 2014-01-20 DIAGNOSIS — M5442 Lumbago with sciatica, left side: Secondary | ICD-10-CM | POA: Diagnosis not present

## 2014-01-20 DIAGNOSIS — M25562 Pain in left knee: Secondary | ICD-10-CM | POA: Diagnosis not present

## 2014-01-20 DIAGNOSIS — M4606 Spinal enthesopathy, lumbar region: Secondary | ICD-10-CM | POA: Diagnosis not present

## 2014-01-22 DIAGNOSIS — M25511 Pain in right shoulder: Secondary | ICD-10-CM | POA: Diagnosis not present

## 2014-01-22 DIAGNOSIS — M9903 Segmental and somatic dysfunction of lumbar region: Secondary | ICD-10-CM | POA: Diagnosis not present

## 2014-01-22 DIAGNOSIS — M25561 Pain in right knee: Secondary | ICD-10-CM | POA: Diagnosis not present

## 2014-01-22 DIAGNOSIS — M25512 Pain in left shoulder: Secondary | ICD-10-CM | POA: Diagnosis not present

## 2014-01-22 DIAGNOSIS — M545 Low back pain: Secondary | ICD-10-CM | POA: Diagnosis not present

## 2014-01-22 DIAGNOSIS — M25562 Pain in left knee: Secondary | ICD-10-CM | POA: Diagnosis not present

## 2014-01-22 DIAGNOSIS — M9904 Segmental and somatic dysfunction of sacral region: Secondary | ICD-10-CM | POA: Diagnosis not present

## 2014-01-22 DIAGNOSIS — M4316 Spondylolisthesis, lumbar region: Secondary | ICD-10-CM | POA: Diagnosis not present

## 2014-01-22 DIAGNOSIS — M170 Bilateral primary osteoarthritis of knee: Secondary | ICD-10-CM | POA: Diagnosis not present

## 2014-01-22 DIAGNOSIS — M5442 Lumbago with sciatica, left side: Secondary | ICD-10-CM | POA: Diagnosis not present

## 2014-01-28 DIAGNOSIS — M25562 Pain in left knee: Secondary | ICD-10-CM | POA: Diagnosis not present

## 2014-01-28 DIAGNOSIS — M9903 Segmental and somatic dysfunction of lumbar region: Secondary | ICD-10-CM | POA: Diagnosis not present

## 2014-01-28 DIAGNOSIS — M9904 Segmental and somatic dysfunction of sacral region: Secondary | ICD-10-CM | POA: Diagnosis not present

## 2014-01-28 DIAGNOSIS — M25561 Pain in right knee: Secondary | ICD-10-CM | POA: Diagnosis not present

## 2014-01-28 DIAGNOSIS — M5442 Lumbago with sciatica, left side: Secondary | ICD-10-CM | POA: Diagnosis not present

## 2014-01-28 DIAGNOSIS — M4316 Spondylolisthesis, lumbar region: Secondary | ICD-10-CM | POA: Diagnosis not present

## 2014-01-28 DIAGNOSIS — M170 Bilateral primary osteoarthritis of knee: Secondary | ICD-10-CM | POA: Diagnosis not present

## 2014-01-30 DIAGNOSIS — M9903 Segmental and somatic dysfunction of lumbar region: Secondary | ICD-10-CM | POA: Diagnosis not present

## 2014-01-30 DIAGNOSIS — M170 Bilateral primary osteoarthritis of knee: Secondary | ICD-10-CM | POA: Diagnosis not present

## 2014-01-30 DIAGNOSIS — M4316 Spondylolisthesis, lumbar region: Secondary | ICD-10-CM | POA: Diagnosis not present

## 2014-01-30 DIAGNOSIS — M9904 Segmental and somatic dysfunction of sacral region: Secondary | ICD-10-CM | POA: Diagnosis not present

## 2014-01-30 DIAGNOSIS — M5442 Lumbago with sciatica, left side: Secondary | ICD-10-CM | POA: Diagnosis not present

## 2014-01-30 DIAGNOSIS — M25561 Pain in right knee: Secondary | ICD-10-CM | POA: Diagnosis not present

## 2014-01-30 DIAGNOSIS — M25562 Pain in left knee: Secondary | ICD-10-CM | POA: Diagnosis not present

## 2014-02-04 DIAGNOSIS — M9903 Segmental and somatic dysfunction of lumbar region: Secondary | ICD-10-CM | POA: Diagnosis not present

## 2014-02-04 DIAGNOSIS — M4316 Spondylolisthesis, lumbar region: Secondary | ICD-10-CM | POA: Diagnosis not present

## 2014-02-04 DIAGNOSIS — M9904 Segmental and somatic dysfunction of sacral region: Secondary | ICD-10-CM | POA: Diagnosis not present

## 2014-02-04 DIAGNOSIS — M170 Bilateral primary osteoarthritis of knee: Secondary | ICD-10-CM | POA: Diagnosis not present

## 2014-02-04 DIAGNOSIS — M5442 Lumbago with sciatica, left side: Secondary | ICD-10-CM | POA: Diagnosis not present

## 2014-02-04 DIAGNOSIS — M25561 Pain in right knee: Secondary | ICD-10-CM | POA: Diagnosis not present

## 2014-02-04 DIAGNOSIS — M25562 Pain in left knee: Secondary | ICD-10-CM | POA: Diagnosis not present

## 2014-02-06 DIAGNOSIS — M25561 Pain in right knee: Secondary | ICD-10-CM | POA: Diagnosis not present

## 2014-02-06 DIAGNOSIS — M9904 Segmental and somatic dysfunction of sacral region: Secondary | ICD-10-CM | POA: Diagnosis not present

## 2014-02-06 DIAGNOSIS — M25562 Pain in left knee: Secondary | ICD-10-CM | POA: Diagnosis not present

## 2014-02-06 DIAGNOSIS — M9903 Segmental and somatic dysfunction of lumbar region: Secondary | ICD-10-CM | POA: Diagnosis not present

## 2014-02-06 DIAGNOSIS — M4316 Spondylolisthesis, lumbar region: Secondary | ICD-10-CM | POA: Diagnosis not present

## 2014-02-06 DIAGNOSIS — M5442 Lumbago with sciatica, left side: Secondary | ICD-10-CM | POA: Diagnosis not present

## 2014-02-06 DIAGNOSIS — M170 Bilateral primary osteoarthritis of knee: Secondary | ICD-10-CM | POA: Diagnosis not present

## 2014-02-10 DIAGNOSIS — M170 Bilateral primary osteoarthritis of knee: Secondary | ICD-10-CM | POA: Diagnosis not present

## 2014-02-10 DIAGNOSIS — M25562 Pain in left knee: Secondary | ICD-10-CM | POA: Diagnosis not present

## 2014-02-10 DIAGNOSIS — M4316 Spondylolisthesis, lumbar region: Secondary | ICD-10-CM | POA: Diagnosis not present

## 2014-02-10 DIAGNOSIS — M5442 Lumbago with sciatica, left side: Secondary | ICD-10-CM | POA: Diagnosis not present

## 2014-02-10 DIAGNOSIS — M9903 Segmental and somatic dysfunction of lumbar region: Secondary | ICD-10-CM | POA: Diagnosis not present

## 2014-02-10 DIAGNOSIS — M9904 Segmental and somatic dysfunction of sacral region: Secondary | ICD-10-CM | POA: Diagnosis not present

## 2014-02-10 DIAGNOSIS — M25561 Pain in right knee: Secondary | ICD-10-CM | POA: Diagnosis not present

## 2014-02-12 DIAGNOSIS — M5442 Lumbago with sciatica, left side: Secondary | ICD-10-CM | POA: Diagnosis not present

## 2014-02-12 DIAGNOSIS — M170 Bilateral primary osteoarthritis of knee: Secondary | ICD-10-CM | POA: Diagnosis not present

## 2014-02-12 DIAGNOSIS — M9904 Segmental and somatic dysfunction of sacral region: Secondary | ICD-10-CM | POA: Diagnosis not present

## 2014-02-12 DIAGNOSIS — M25562 Pain in left knee: Secondary | ICD-10-CM | POA: Diagnosis not present

## 2014-02-12 DIAGNOSIS — M25561 Pain in right knee: Secondary | ICD-10-CM | POA: Diagnosis not present

## 2014-02-12 DIAGNOSIS — M4316 Spondylolisthesis, lumbar region: Secondary | ICD-10-CM | POA: Diagnosis not present

## 2014-02-12 DIAGNOSIS — M9903 Segmental and somatic dysfunction of lumbar region: Secondary | ICD-10-CM | POA: Diagnosis not present

## 2014-02-20 DIAGNOSIS — M4316 Spondylolisthesis, lumbar region: Secondary | ICD-10-CM | POA: Diagnosis not present

## 2014-02-20 DIAGNOSIS — M9903 Segmental and somatic dysfunction of lumbar region: Secondary | ICD-10-CM | POA: Diagnosis not present

## 2014-02-20 DIAGNOSIS — M9904 Segmental and somatic dysfunction of sacral region: Secondary | ICD-10-CM | POA: Diagnosis not present

## 2014-02-20 DIAGNOSIS — M25562 Pain in left knee: Secondary | ICD-10-CM | POA: Diagnosis not present

## 2014-02-20 DIAGNOSIS — M25561 Pain in right knee: Secondary | ICD-10-CM | POA: Diagnosis not present

## 2014-02-20 DIAGNOSIS — M5442 Lumbago with sciatica, left side: Secondary | ICD-10-CM | POA: Diagnosis not present

## 2014-02-20 DIAGNOSIS — M170 Bilateral primary osteoarthritis of knee: Secondary | ICD-10-CM | POA: Diagnosis not present

## 2014-02-27 DIAGNOSIS — M9903 Segmental and somatic dysfunction of lumbar region: Secondary | ICD-10-CM | POA: Diagnosis not present

## 2014-02-27 DIAGNOSIS — M4316 Spondylolisthesis, lumbar region: Secondary | ICD-10-CM | POA: Diagnosis not present

## 2014-02-27 DIAGNOSIS — M9904 Segmental and somatic dysfunction of sacral region: Secondary | ICD-10-CM | POA: Diagnosis not present

## 2014-02-27 DIAGNOSIS — M170 Bilateral primary osteoarthritis of knee: Secondary | ICD-10-CM | POA: Diagnosis not present

## 2014-02-27 DIAGNOSIS — M25562 Pain in left knee: Secondary | ICD-10-CM | POA: Diagnosis not present

## 2014-02-27 DIAGNOSIS — M5442 Lumbago with sciatica, left side: Secondary | ICD-10-CM | POA: Diagnosis not present

## 2014-02-27 DIAGNOSIS — M25561 Pain in right knee: Secondary | ICD-10-CM | POA: Diagnosis not present

## 2014-03-04 DIAGNOSIS — N486 Induration penis plastica: Secondary | ICD-10-CM | POA: Diagnosis not present

## 2014-03-04 DIAGNOSIS — E291 Testicular hypofunction: Secondary | ICD-10-CM | POA: Diagnosis not present

## 2014-03-11 DIAGNOSIS — M5137 Other intervertebral disc degeneration, lumbosacral region: Secondary | ICD-10-CM | POA: Diagnosis not present

## 2014-03-11 DIAGNOSIS — M4316 Spondylolisthesis, lumbar region: Secondary | ICD-10-CM | POA: Diagnosis not present

## 2014-03-11 DIAGNOSIS — M1711 Unilateral primary osteoarthritis, right knee: Secondary | ICD-10-CM | POA: Diagnosis not present

## 2014-03-11 DIAGNOSIS — M5386 Other specified dorsopathies, lumbar region: Secondary | ICD-10-CM | POA: Diagnosis not present

## 2014-04-08 DIAGNOSIS — Z125 Encounter for screening for malignant neoplasm of prostate: Secondary | ICD-10-CM | POA: Diagnosis not present

## 2014-04-08 DIAGNOSIS — E0821 Diabetes mellitus due to underlying condition with diabetic nephropathy: Secondary | ICD-10-CM | POA: Diagnosis not present

## 2014-04-08 DIAGNOSIS — N429 Disorder of prostate, unspecified: Secondary | ICD-10-CM | POA: Diagnosis not present

## 2014-04-08 DIAGNOSIS — R7309 Other abnormal glucose: Secondary | ICD-10-CM | POA: Diagnosis not present

## 2014-04-08 DIAGNOSIS — E782 Mixed hyperlipidemia: Secondary | ICD-10-CM | POA: Diagnosis not present

## 2014-04-08 DIAGNOSIS — I10 Essential (primary) hypertension: Secondary | ICD-10-CM | POA: Diagnosis not present

## 2014-04-08 DIAGNOSIS — M1990 Unspecified osteoarthritis, unspecified site: Secondary | ICD-10-CM | POA: Diagnosis not present

## 2014-04-18 DIAGNOSIS — E291 Testicular hypofunction: Secondary | ICD-10-CM | POA: Diagnosis not present

## 2014-04-18 DIAGNOSIS — N138 Other obstructive and reflux uropathy: Secondary | ICD-10-CM | POA: Diagnosis not present

## 2014-04-18 DIAGNOSIS — N486 Induration penis plastica: Secondary | ICD-10-CM | POA: Diagnosis not present

## 2014-04-18 DIAGNOSIS — N401 Enlarged prostate with lower urinary tract symptoms: Secondary | ICD-10-CM | POA: Diagnosis not present

## 2014-05-06 DIAGNOSIS — M159 Polyosteoarthritis, unspecified: Secondary | ICD-10-CM | POA: Diagnosis not present

## 2014-05-06 DIAGNOSIS — M62830 Muscle spasm of back: Secondary | ICD-10-CM | POA: Diagnosis not present

## 2014-05-08 DIAGNOSIS — M62830 Muscle spasm of back: Secondary | ICD-10-CM | POA: Diagnosis not present

## 2014-05-09 DIAGNOSIS — M62830 Muscle spasm of back: Secondary | ICD-10-CM | POA: Diagnosis not present

## 2014-05-12 ENCOUNTER — Ambulatory Visit
Admission: RE | Admit: 2014-05-12 | Discharge: 2014-05-12 | Disposition: A | Discharge status: Home / Self Care | Payer: Medicare Other | Source: Ambulatory Visit | Attending: Family Medicine | Admitting: Family Medicine

## 2014-05-12 ENCOUNTER — Other Ambulatory Visit: Payer: Self-pay | Admitting: Family Medicine

## 2014-05-12 DIAGNOSIS — M62830 Muscle spasm of back: Secondary | ICD-10-CM

## 2014-05-12 DIAGNOSIS — M159 Polyosteoarthritis, unspecified: Secondary | ICD-10-CM

## 2014-05-12 DIAGNOSIS — M5032 Other cervical disc degeneration, mid-cervical region: Secondary | ICD-10-CM | POA: Diagnosis not present

## 2014-05-12 DIAGNOSIS — M5033 Other cervical disc degeneration, cervicothoracic region: Secondary | ICD-10-CM | POA: Diagnosis not present

## 2014-05-12 DIAGNOSIS — M47812 Spondylosis without myelopathy or radiculopathy, cervical region: Secondary | ICD-10-CM | POA: Diagnosis not present

## 2014-05-13 DIAGNOSIS — S1090XD Unspecified superficial injury of unspecified part of neck, subsequent encounter: Secondary | ICD-10-CM | POA: Diagnosis not present

## 2014-05-13 DIAGNOSIS — K529 Noninfective gastroenteritis and colitis, unspecified: Secondary | ICD-10-CM | POA: Diagnosis not present

## 2014-05-13 DIAGNOSIS — M62830 Muscle spasm of back: Secondary | ICD-10-CM | POA: Diagnosis not present

## 2014-05-19 DIAGNOSIS — M62830 Muscle spasm of back: Secondary | ICD-10-CM | POA: Diagnosis not present

## 2014-05-30 DIAGNOSIS — N401 Enlarged prostate with lower urinary tract symptoms: Secondary | ICD-10-CM | POA: Diagnosis not present

## 2014-05-30 DIAGNOSIS — E291 Testicular hypofunction: Secondary | ICD-10-CM | POA: Diagnosis not present

## 2014-05-30 DIAGNOSIS — T387X5D Adverse effect of androgens and anabolic congeners, subsequent encounter: Secondary | ICD-10-CM | POA: Diagnosis not present

## 2014-05-30 DIAGNOSIS — R358 Other polyuria: Secondary | ICD-10-CM | POA: Diagnosis not present

## 2014-05-30 DIAGNOSIS — N138 Other obstructive and reflux uropathy: Secondary | ICD-10-CM | POA: Diagnosis not present

## 2014-08-06 DIAGNOSIS — M1990 Unspecified osteoarthritis, unspecified site: Secondary | ICD-10-CM | POA: Diagnosis not present

## 2014-08-06 DIAGNOSIS — E782 Mixed hyperlipidemia: Secondary | ICD-10-CM | POA: Diagnosis not present

## 2014-08-06 DIAGNOSIS — I10 Essential (primary) hypertension: Secondary | ICD-10-CM | POA: Diagnosis not present

## 2014-08-06 DIAGNOSIS — M159 Polyosteoarthritis, unspecified: Secondary | ICD-10-CM | POA: Diagnosis not present

## 2014-08-22 DIAGNOSIS — N401 Enlarged prostate with lower urinary tract symptoms: Secondary | ICD-10-CM | POA: Diagnosis not present

## 2014-08-22 DIAGNOSIS — E291 Testicular hypofunction: Secondary | ICD-10-CM | POA: Diagnosis not present

## 2014-08-22 DIAGNOSIS — N138 Other obstructive and reflux uropathy: Secondary | ICD-10-CM | POA: Diagnosis not present

## 2014-08-25 DIAGNOSIS — I10 Essential (primary) hypertension: Secondary | ICD-10-CM | POA: Diagnosis not present

## 2014-09-16 DIAGNOSIS — I10 Essential (primary) hypertension: Secondary | ICD-10-CM | POA: Diagnosis not present

## 2014-10-10 DIAGNOSIS — N138 Other obstructive and reflux uropathy: Secondary | ICD-10-CM | POA: Diagnosis not present

## 2014-10-10 DIAGNOSIS — N486 Induration penis plastica: Secondary | ICD-10-CM | POA: Diagnosis not present

## 2014-10-10 DIAGNOSIS — N401 Enlarged prostate with lower urinary tract symptoms: Secondary | ICD-10-CM | POA: Diagnosis not present

## 2014-10-10 DIAGNOSIS — E291 Testicular hypofunction: Secondary | ICD-10-CM | POA: Diagnosis not present

## 2014-11-07 DIAGNOSIS — T387X5D Adverse effect of androgens and anabolic congeners, subsequent encounter: Secondary | ICD-10-CM | POA: Diagnosis not present

## 2014-11-07 DIAGNOSIS — R358 Other polyuria: Secondary | ICD-10-CM | POA: Diagnosis not present

## 2014-11-07 DIAGNOSIS — N401 Enlarged prostate with lower urinary tract symptoms: Secondary | ICD-10-CM | POA: Diagnosis not present

## 2014-11-07 DIAGNOSIS — E291 Testicular hypofunction: Secondary | ICD-10-CM | POA: Diagnosis not present

## 2014-11-07 DIAGNOSIS — N138 Other obstructive and reflux uropathy: Secondary | ICD-10-CM | POA: Diagnosis not present

## 2014-11-12 DIAGNOSIS — M62830 Muscle spasm of back: Secondary | ICD-10-CM | POA: Diagnosis not present

## 2014-11-18 DIAGNOSIS — S1090XD Unspecified superficial injury of unspecified part of neck, subsequent encounter: Secondary | ICD-10-CM | POA: Diagnosis not present

## 2014-11-18 DIAGNOSIS — M62830 Muscle spasm of back: Secondary | ICD-10-CM | POA: Diagnosis not present

## 2015-01-14 DIAGNOSIS — E782 Mixed hyperlipidemia: Secondary | ICD-10-CM | POA: Diagnosis not present

## 2015-01-14 DIAGNOSIS — M159 Polyosteoarthritis, unspecified: Secondary | ICD-10-CM | POA: Diagnosis not present

## 2015-01-14 DIAGNOSIS — I10 Essential (primary) hypertension: Secondary | ICD-10-CM | POA: Diagnosis not present

## 2015-01-14 DIAGNOSIS — R7309 Other abnormal glucose: Secondary | ICD-10-CM | POA: Diagnosis not present

## 2015-02-06 DIAGNOSIS — N138 Other obstructive and reflux uropathy: Secondary | ICD-10-CM | POA: Diagnosis not present

## 2015-02-06 DIAGNOSIS — R339 Retention of urine, unspecified: Secondary | ICD-10-CM | POA: Diagnosis not present

## 2015-02-06 DIAGNOSIS — N401 Enlarged prostate with lower urinary tract symptoms: Secondary | ICD-10-CM | POA: Diagnosis not present

## 2015-04-09 DIAGNOSIS — T387X5D Adverse effect of androgens and anabolic congeners, subsequent encounter: Secondary | ICD-10-CM | POA: Diagnosis not present

## 2015-04-09 DIAGNOSIS — E291 Testicular hypofunction: Secondary | ICD-10-CM | POA: Diagnosis not present

## 2015-04-09 DIAGNOSIS — N401 Enlarged prostate with lower urinary tract symptoms: Secondary | ICD-10-CM | POA: Diagnosis not present

## 2015-04-09 DIAGNOSIS — N138 Other obstructive and reflux uropathy: Secondary | ICD-10-CM | POA: Diagnosis not present

## 2015-04-30 DIAGNOSIS — N401 Enlarged prostate with lower urinary tract symptoms: Secondary | ICD-10-CM | POA: Diagnosis not present

## 2015-04-30 DIAGNOSIS — M9903 Segmental and somatic dysfunction of lumbar region: Secondary | ICD-10-CM | POA: Diagnosis not present

## 2015-04-30 DIAGNOSIS — M9905 Segmental and somatic dysfunction of pelvic region: Secondary | ICD-10-CM | POA: Diagnosis not present

## 2015-04-30 DIAGNOSIS — E291 Testicular hypofunction: Secondary | ICD-10-CM | POA: Diagnosis not present

## 2015-04-30 DIAGNOSIS — N138 Other obstructive and reflux uropathy: Secondary | ICD-10-CM | POA: Diagnosis not present

## 2015-05-05 DIAGNOSIS — M4606 Spinal enthesopathy, lumbar region: Secondary | ICD-10-CM | POA: Diagnosis not present

## 2015-05-05 DIAGNOSIS — M5386 Other specified dorsopathies, lumbar region: Secondary | ICD-10-CM | POA: Diagnosis not present

## 2015-05-07 DIAGNOSIS — J09X2 Influenza due to identified novel influenza A virus with other respiratory manifestations: Secondary | ICD-10-CM | POA: Diagnosis not present

## 2015-05-20 DIAGNOSIS — M9903 Segmental and somatic dysfunction of lumbar region: Secondary | ICD-10-CM | POA: Diagnosis not present

## 2015-05-20 DIAGNOSIS — M9905 Segmental and somatic dysfunction of pelvic region: Secondary | ICD-10-CM | POA: Diagnosis not present

## 2015-05-21 DIAGNOSIS — M9905 Segmental and somatic dysfunction of pelvic region: Secondary | ICD-10-CM | POA: Diagnosis not present

## 2015-05-21 DIAGNOSIS — M9903 Segmental and somatic dysfunction of lumbar region: Secondary | ICD-10-CM | POA: Diagnosis not present

## 2015-05-26 DIAGNOSIS — M9905 Segmental and somatic dysfunction of pelvic region: Secondary | ICD-10-CM | POA: Diagnosis not present

## 2015-05-26 DIAGNOSIS — M9903 Segmental and somatic dysfunction of lumbar region: Secondary | ICD-10-CM | POA: Diagnosis not present

## 2015-05-28 DIAGNOSIS — M9903 Segmental and somatic dysfunction of lumbar region: Secondary | ICD-10-CM | POA: Diagnosis not present

## 2015-05-28 DIAGNOSIS — T387X5D Adverse effect of androgens and anabolic congeners, subsequent encounter: Secondary | ICD-10-CM | POA: Diagnosis not present

## 2015-05-28 DIAGNOSIS — M9905 Segmental and somatic dysfunction of pelvic region: Secondary | ICD-10-CM | POA: Diagnosis not present

## 2015-05-28 DIAGNOSIS — M4606 Spinal enthesopathy, lumbar region: Secondary | ICD-10-CM | POA: Diagnosis not present

## 2015-05-28 DIAGNOSIS — N138 Other obstructive and reflux uropathy: Secondary | ICD-10-CM | POA: Diagnosis not present

## 2015-05-28 DIAGNOSIS — N401 Enlarged prostate with lower urinary tract symptoms: Secondary | ICD-10-CM | POA: Diagnosis not present

## 2015-05-28 DIAGNOSIS — R358 Other polyuria: Secondary | ICD-10-CM | POA: Diagnosis not present

## 2015-05-28 DIAGNOSIS — E291 Testicular hypofunction: Secondary | ICD-10-CM | POA: Diagnosis not present

## 2015-05-28 DIAGNOSIS — M5386 Other specified dorsopathies, lumbar region: Secondary | ICD-10-CM | POA: Diagnosis not present

## 2015-06-02 DIAGNOSIS — M9905 Segmental and somatic dysfunction of pelvic region: Secondary | ICD-10-CM | POA: Diagnosis not present

## 2015-06-02 DIAGNOSIS — M9903 Segmental and somatic dysfunction of lumbar region: Secondary | ICD-10-CM | POA: Diagnosis not present

## 2015-06-02 DIAGNOSIS — M4606 Spinal enthesopathy, lumbar region: Secondary | ICD-10-CM | POA: Diagnosis not present

## 2015-06-02 DIAGNOSIS — M5386 Other specified dorsopathies, lumbar region: Secondary | ICD-10-CM | POA: Diagnosis not present

## 2015-06-04 DIAGNOSIS — M4606 Spinal enthesopathy, lumbar region: Secondary | ICD-10-CM | POA: Diagnosis not present

## 2015-06-04 DIAGNOSIS — M5386 Other specified dorsopathies, lumbar region: Secondary | ICD-10-CM | POA: Diagnosis not present

## 2015-06-04 DIAGNOSIS — M9905 Segmental and somatic dysfunction of pelvic region: Secondary | ICD-10-CM | POA: Diagnosis not present

## 2015-06-04 DIAGNOSIS — M9903 Segmental and somatic dysfunction of lumbar region: Secondary | ICD-10-CM | POA: Diagnosis not present

## 2015-06-11 DIAGNOSIS — M5386 Other specified dorsopathies, lumbar region: Secondary | ICD-10-CM | POA: Diagnosis not present

## 2015-06-11 DIAGNOSIS — M9903 Segmental and somatic dysfunction of lumbar region: Secondary | ICD-10-CM | POA: Diagnosis not present

## 2015-06-11 DIAGNOSIS — M4606 Spinal enthesopathy, lumbar region: Secondary | ICD-10-CM | POA: Diagnosis not present

## 2015-06-11 DIAGNOSIS — M9905 Segmental and somatic dysfunction of pelvic region: Secondary | ICD-10-CM | POA: Diagnosis not present

## 2015-06-18 DIAGNOSIS — M9905 Segmental and somatic dysfunction of pelvic region: Secondary | ICD-10-CM | POA: Diagnosis not present

## 2015-06-18 DIAGNOSIS — M9903 Segmental and somatic dysfunction of lumbar region: Secondary | ICD-10-CM | POA: Diagnosis not present

## 2015-07-06 DIAGNOSIS — Z6825 Body mass index (BMI) 25.0-25.9, adult: Secondary | ICD-10-CM | POA: Diagnosis not present

## 2015-07-06 DIAGNOSIS — M1990 Unspecified osteoarthritis, unspecified site: Secondary | ICD-10-CM | POA: Diagnosis not present

## 2015-07-06 DIAGNOSIS — I10 Essential (primary) hypertension: Secondary | ICD-10-CM | POA: Diagnosis not present

## 2015-07-06 DIAGNOSIS — R7309 Other abnormal glucose: Secondary | ICD-10-CM | POA: Diagnosis not present

## 2015-09-29 DIAGNOSIS — E291 Testicular hypofunction: Secondary | ICD-10-CM | POA: Diagnosis not present

## 2015-09-29 DIAGNOSIS — N138 Other obstructive and reflux uropathy: Secondary | ICD-10-CM | POA: Diagnosis not present

## 2015-09-29 DIAGNOSIS — R358 Other polyuria: Secondary | ICD-10-CM | POA: Diagnosis not present

## 2015-09-29 DIAGNOSIS — N401 Enlarged prostate with lower urinary tract symptoms: Secondary | ICD-10-CM | POA: Diagnosis not present

## 2015-09-29 DIAGNOSIS — T387X5D Adverse effect of androgens and anabolic congeners, subsequent encounter: Secondary | ICD-10-CM | POA: Diagnosis not present

## 2015-10-20 DIAGNOSIS — N401 Enlarged prostate with lower urinary tract symptoms: Secondary | ICD-10-CM | POA: Diagnosis not present

## 2015-10-20 DIAGNOSIS — E291 Testicular hypofunction: Secondary | ICD-10-CM | POA: Diagnosis not present

## 2015-10-20 DIAGNOSIS — N138 Other obstructive and reflux uropathy: Secondary | ICD-10-CM | POA: Diagnosis not present

## 2015-11-03 DIAGNOSIS — I10 Essential (primary) hypertension: Secondary | ICD-10-CM | POA: Diagnosis not present

## 2015-11-03 DIAGNOSIS — M159 Polyosteoarthritis, unspecified: Secondary | ICD-10-CM | POA: Diagnosis not present

## 2015-11-03 DIAGNOSIS — E782 Mixed hyperlipidemia: Secondary | ICD-10-CM | POA: Diagnosis not present

## 2015-11-12 DIAGNOSIS — M47896 Other spondylosis, lumbar region: Secondary | ICD-10-CM | POA: Diagnosis not present

## 2015-11-12 DIAGNOSIS — M4316 Spondylolisthesis, lumbar region: Secondary | ICD-10-CM | POA: Diagnosis not present

## 2015-11-12 DIAGNOSIS — M5442 Lumbago with sciatica, left side: Secondary | ICD-10-CM | POA: Diagnosis not present

## 2015-11-17 DIAGNOSIS — M5442 Lumbago with sciatica, left side: Secondary | ICD-10-CM | POA: Diagnosis not present

## 2015-11-19 DIAGNOSIS — M47896 Other spondylosis, lumbar region: Secondary | ICD-10-CM | POA: Diagnosis not present

## 2015-11-19 DIAGNOSIS — M4316 Spondylolisthesis, lumbar region: Secondary | ICD-10-CM | POA: Diagnosis not present

## 2015-11-19 DIAGNOSIS — M5442 Lumbago with sciatica, left side: Secondary | ICD-10-CM | POA: Diagnosis not present

## 2015-12-01 DIAGNOSIS — M4316 Spondylolisthesis, lumbar region: Secondary | ICD-10-CM | POA: Diagnosis not present

## 2015-12-01 DIAGNOSIS — M5442 Lumbago with sciatica, left side: Secondary | ICD-10-CM | POA: Diagnosis not present

## 2015-12-02 ENCOUNTER — Other Ambulatory Visit: Payer: Self-pay | Admitting: Physical Medicine and Rehabilitation

## 2015-12-02 DIAGNOSIS — G8929 Other chronic pain: Secondary | ICD-10-CM

## 2015-12-02 DIAGNOSIS — M5442 Lumbago with sciatica, left side: Principal | ICD-10-CM

## 2015-12-08 ENCOUNTER — Ambulatory Visit
Admission: RE | Admit: 2015-12-08 | Discharge: 2015-12-08 | Disposition: A | Discharge status: Home / Self Care | Payer: Medicare Other | Source: Ambulatory Visit | Attending: Physical Medicine and Rehabilitation | Admitting: Physical Medicine and Rehabilitation

## 2015-12-08 DIAGNOSIS — M5442 Lumbago with sciatica, left side: Principal | ICD-10-CM

## 2015-12-08 DIAGNOSIS — G8929 Other chronic pain: Secondary | ICD-10-CM

## 2015-12-08 DIAGNOSIS — M545 Low back pain: Secondary | ICD-10-CM | POA: Diagnosis not present

## 2015-12-08 MED ORDER — IOPAMIDOL (ISOVUE-M 200) INJECTION 41%
1.0000 mL | Freq: Once | INTRAMUSCULAR | Status: AC
  Administered 2015-12-08: 1 mL via EPIDURAL

## 2015-12-08 MED ORDER — METHYLPREDNISOLONE ACETATE 40 MG/ML INJ SUSP (RADIOLOG
120.0000 mg | Freq: Once | INTRAMUSCULAR | Status: AC
  Administered 2015-12-08: 120 mg via EPIDURAL

## 2015-12-08 NOTE — Discharge Instructions (Signed)

## 2015-12-11 IMAGING — CR DG CERVICAL SPINE COMPLETE 4+V
5 series · 5 of 5 positions shown · non-contrast
Comparison: None.

CLINICAL DATA: Neck and right shoulder pain for 3 weeks.

EXAM:
CERVICAL SPINE  4+ VIEWS

[w c-spine lat]
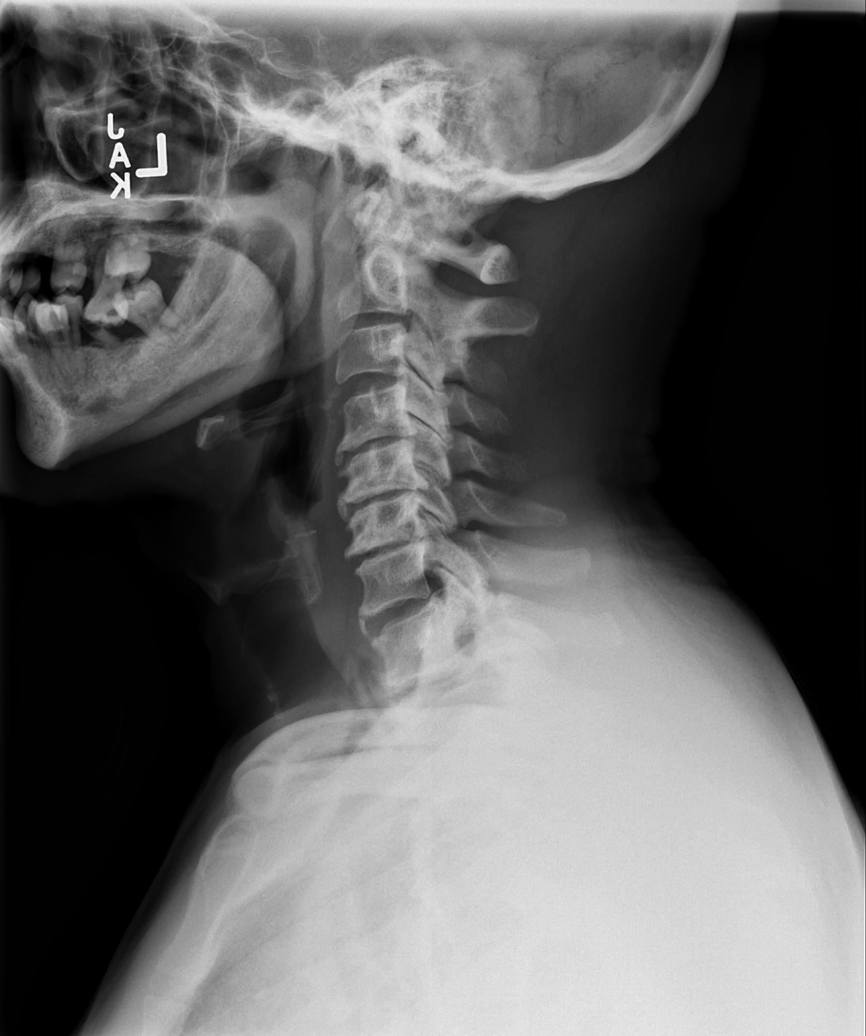

[w c-spine oblique (1 of 2)]
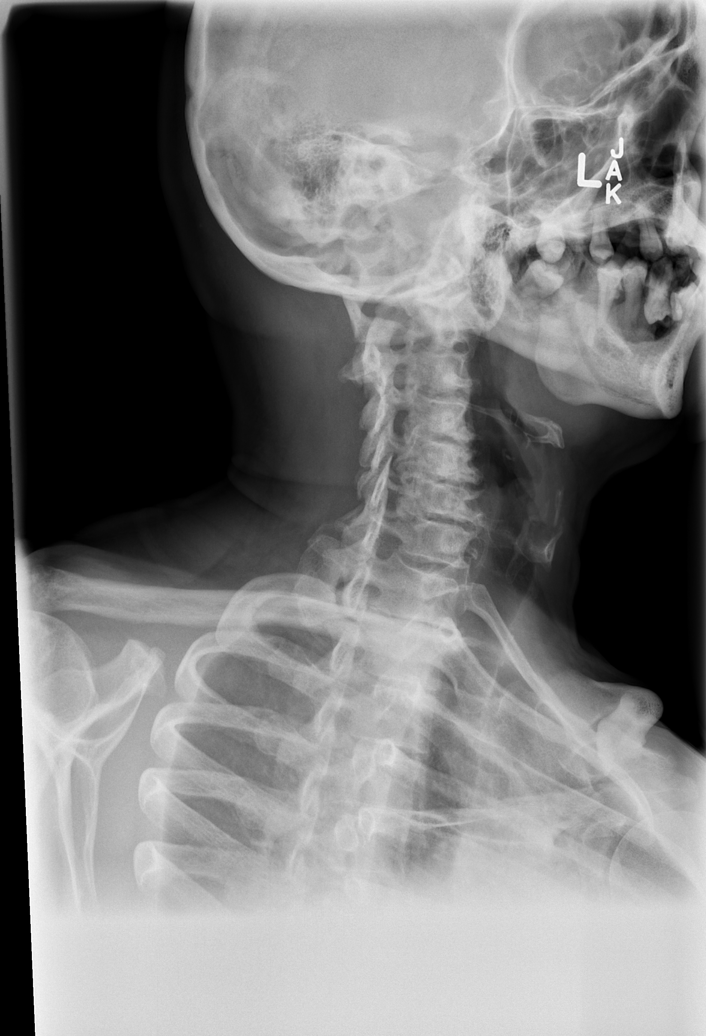

[w c-spine oblique (2 of 2)]
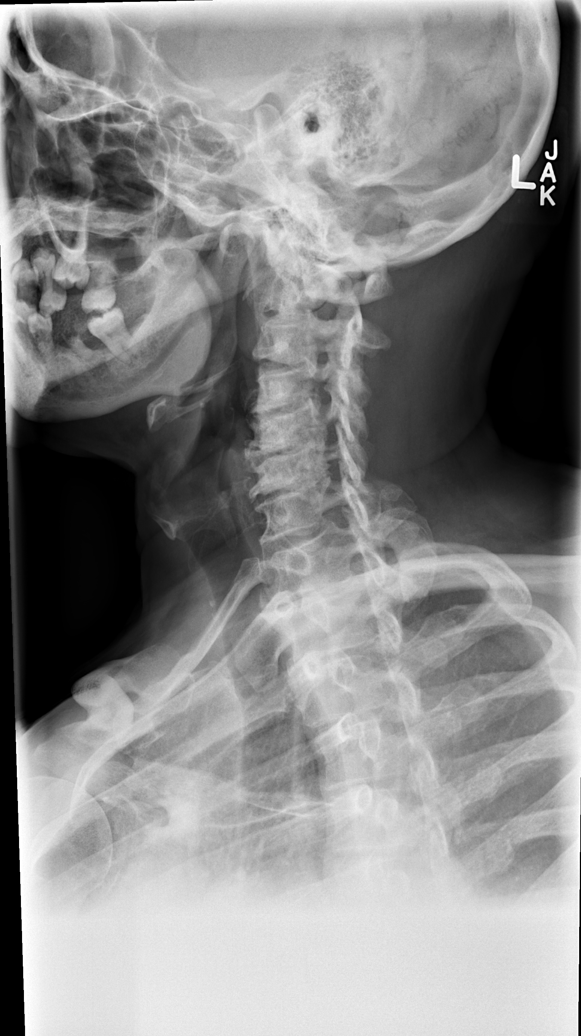

[w c-spine a.p. *]
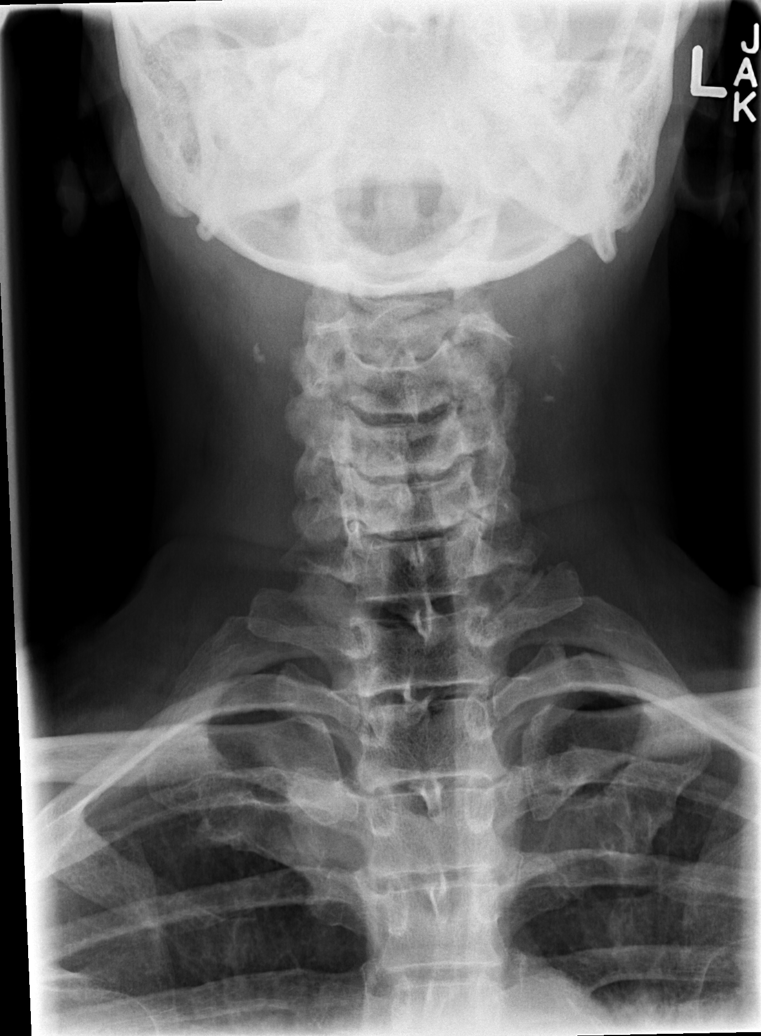

[w c-spine odontoid *]
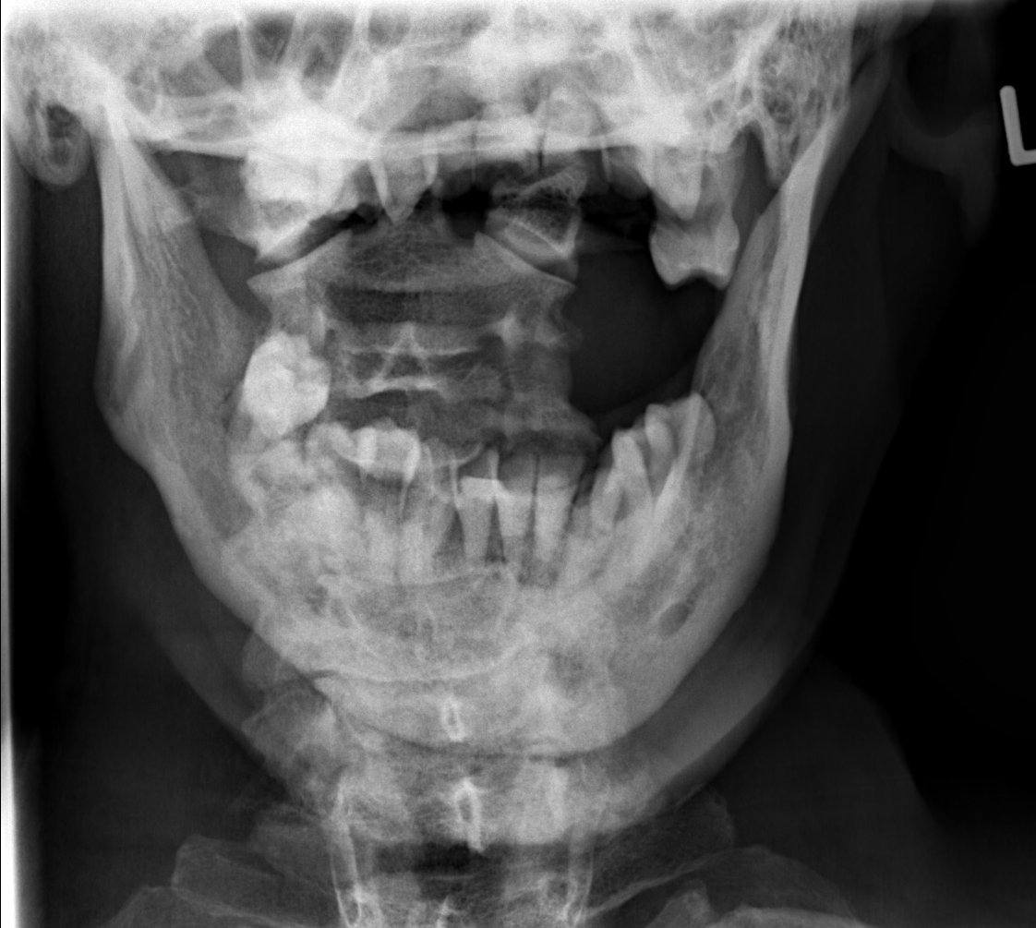

[5 of 5 positions shown; findings below may reference images not displayed]

FINDINGS: There is no evidence of cervical spine fracture or prevertebral soft
tissue swelling. Alignment is normal.

Moderate to severe degenerative disc disease is seen at levels of
C4-5, C5-6, and C6-7. Bilateral uncovertebral spurring also results
in bilateral neural foraminal stenosis at these levels, most severe
on the right side at C5-6. Mild degenerative disc disease also seen
at C7-T1. Mild left-sided facet DJD also noted at levels of C3-4 and
C4-5.
IMPRESSION: No acute findings.  Degenerative spondylosis, as described above.

## 2015-12-17 DIAGNOSIS — M4807 Spinal stenosis, lumbosacral region: Secondary | ICD-10-CM | POA: Diagnosis not present

## 2015-12-17 DIAGNOSIS — M4316 Spondylolisthesis, lumbar region: Secondary | ICD-10-CM | POA: Diagnosis not present

## 2015-12-17 DIAGNOSIS — M5136 Other intervertebral disc degeneration, lumbar region: Secondary | ICD-10-CM | POA: Diagnosis not present

## 2015-12-18 DIAGNOSIS — T387X5D Adverse effect of androgens and anabolic congeners, subsequent encounter: Secondary | ICD-10-CM | POA: Diagnosis not present

## 2015-12-18 DIAGNOSIS — N138 Other obstructive and reflux uropathy: Secondary | ICD-10-CM | POA: Diagnosis not present

## 2015-12-18 DIAGNOSIS — E291 Testicular hypofunction: Secondary | ICD-10-CM | POA: Diagnosis not present

## 2015-12-18 DIAGNOSIS — N401 Enlarged prostate with lower urinary tract symptoms: Secondary | ICD-10-CM | POA: Diagnosis not present

## 2015-12-18 DIAGNOSIS — R358 Other polyuria: Secondary | ICD-10-CM | POA: Diagnosis not present

## 2015-12-21 ENCOUNTER — Other Ambulatory Visit: Payer: Self-pay | Admitting: Physical Medicine and Rehabilitation

## 2015-12-21 DIAGNOSIS — M4316 Spondylolisthesis, lumbar region: Secondary | ICD-10-CM

## 2015-12-30 ENCOUNTER — Other Ambulatory Visit: Payer: Self-pay | Admitting: Physical Medicine and Rehabilitation

## 2015-12-30 ENCOUNTER — Ambulatory Visit
Admission: RE | Admit: 2015-12-30 | Discharge: 2015-12-30 | Disposition: A | Discharge status: Home / Self Care | Payer: Medicare Other | Source: Ambulatory Visit | Attending: Physical Medicine and Rehabilitation | Admitting: Physical Medicine and Rehabilitation

## 2015-12-30 DIAGNOSIS — M4316 Spondylolisthesis, lumbar region: Secondary | ICD-10-CM

## 2015-12-30 DIAGNOSIS — M5126 Other intervertebral disc displacement, lumbar region: Secondary | ICD-10-CM | POA: Diagnosis not present

## 2015-12-30 MED ORDER — METHYLPREDNISOLONE ACETATE 40 MG/ML INJ SUSP (RADIOLOG
120.0000 mg | Freq: Once | INTRAMUSCULAR | Status: AC
  Administered 2015-12-30: 120 mg via EPIDURAL

## 2015-12-30 MED ORDER — IOPAMIDOL (ISOVUE-M 200) INJECTION 41%
1.0000 mL | Freq: Once | INTRAMUSCULAR | Status: AC
  Administered 2015-12-30: 1 mL via EPIDURAL

## 2015-12-30 NOTE — Discharge Instructions (Signed)

## 2016-01-01 DIAGNOSIS — N401 Enlarged prostate with lower urinary tract symptoms: Secondary | ICD-10-CM | POA: Diagnosis not present

## 2016-01-01 DIAGNOSIS — N138 Other obstructive and reflux uropathy: Secondary | ICD-10-CM | POA: Diagnosis not present

## 2016-01-04 DIAGNOSIS — M5442 Lumbago with sciatica, left side: Secondary | ICD-10-CM | POA: Diagnosis not present

## 2016-01-07 DIAGNOSIS — M5442 Lumbago with sciatica, left side: Secondary | ICD-10-CM | POA: Diagnosis not present

## 2016-01-11 DIAGNOSIS — M5442 Lumbago with sciatica, left side: Secondary | ICD-10-CM | POA: Diagnosis not present

## 2016-01-15 DIAGNOSIS — M5442 Lumbago with sciatica, left side: Secondary | ICD-10-CM | POA: Diagnosis not present

## 2016-01-19 DIAGNOSIS — M5136 Other intervertebral disc degeneration, lumbar region: Secondary | ICD-10-CM | POA: Diagnosis not present

## 2016-01-19 DIAGNOSIS — M4807 Spinal stenosis, lumbosacral region: Secondary | ICD-10-CM | POA: Diagnosis not present

## 2016-01-19 DIAGNOSIS — M4316 Spondylolisthesis, lumbar region: Secondary | ICD-10-CM | POA: Diagnosis not present

## 2016-01-22 DIAGNOSIS — M5116 Intervertebral disc disorders with radiculopathy, lumbar region: Secondary | ICD-10-CM | POA: Diagnosis not present

## 2016-01-22 DIAGNOSIS — M9903 Segmental and somatic dysfunction of lumbar region: Secondary | ICD-10-CM | POA: Diagnosis not present

## 2016-01-22 DIAGNOSIS — M5136 Other intervertebral disc degeneration, lumbar region: Secondary | ICD-10-CM | POA: Diagnosis not present

## 2016-01-22 DIAGNOSIS — M62830 Muscle spasm of back: Secondary | ICD-10-CM | POA: Diagnosis not present

## 2016-01-22 DIAGNOSIS — M545 Low back pain: Secondary | ICD-10-CM | POA: Diagnosis not present

## 2016-01-25 DIAGNOSIS — M5116 Intervertebral disc disorders with radiculopathy, lumbar region: Secondary | ICD-10-CM | POA: Diagnosis not present

## 2016-01-25 DIAGNOSIS — M62830 Muscle spasm of back: Secondary | ICD-10-CM | POA: Diagnosis not present

## 2016-01-25 DIAGNOSIS — M545 Low back pain: Secondary | ICD-10-CM | POA: Diagnosis not present

## 2016-01-25 DIAGNOSIS — M5136 Other intervertebral disc degeneration, lumbar region: Secondary | ICD-10-CM | POA: Diagnosis not present

## 2016-01-25 DIAGNOSIS — M9903 Segmental and somatic dysfunction of lumbar region: Secondary | ICD-10-CM | POA: Diagnosis not present

## 2016-01-27 DIAGNOSIS — M9903 Segmental and somatic dysfunction of lumbar region: Secondary | ICD-10-CM | POA: Diagnosis not present

## 2016-01-27 DIAGNOSIS — M62830 Muscle spasm of back: Secondary | ICD-10-CM | POA: Diagnosis not present

## 2016-01-27 DIAGNOSIS — M5116 Intervertebral disc disorders with radiculopathy, lumbar region: Secondary | ICD-10-CM | POA: Diagnosis not present

## 2016-01-27 DIAGNOSIS — M545 Low back pain: Secondary | ICD-10-CM | POA: Diagnosis not present

## 2016-01-27 DIAGNOSIS — M5136 Other intervertebral disc degeneration, lumbar region: Secondary | ICD-10-CM | POA: Diagnosis not present

## 2016-01-28 DIAGNOSIS — M545 Low back pain: Secondary | ICD-10-CM | POA: Diagnosis not present

## 2016-01-28 DIAGNOSIS — M5116 Intervertebral disc disorders with radiculopathy, lumbar region: Secondary | ICD-10-CM | POA: Diagnosis not present

## 2016-01-28 DIAGNOSIS — M9903 Segmental and somatic dysfunction of lumbar region: Secondary | ICD-10-CM | POA: Diagnosis not present

## 2016-01-28 DIAGNOSIS — M62830 Muscle spasm of back: Secondary | ICD-10-CM | POA: Diagnosis not present

## 2016-01-28 DIAGNOSIS — M5136 Other intervertebral disc degeneration, lumbar region: Secondary | ICD-10-CM | POA: Diagnosis not present

## 2016-02-01 DIAGNOSIS — M9903 Segmental and somatic dysfunction of lumbar region: Secondary | ICD-10-CM | POA: Diagnosis not present

## 2016-02-01 DIAGNOSIS — M62830 Muscle spasm of back: Secondary | ICD-10-CM | POA: Diagnosis not present

## 2016-02-01 DIAGNOSIS — M5116 Intervertebral disc disorders with radiculopathy, lumbar region: Secondary | ICD-10-CM | POA: Diagnosis not present

## 2016-02-01 DIAGNOSIS — M545 Low back pain: Secondary | ICD-10-CM | POA: Diagnosis not present

## 2016-02-01 DIAGNOSIS — M5136 Other intervertebral disc degeneration, lumbar region: Secondary | ICD-10-CM | POA: Diagnosis not present

## 2016-02-03 DIAGNOSIS — M5116 Intervertebral disc disorders with radiculopathy, lumbar region: Secondary | ICD-10-CM | POA: Diagnosis not present

## 2016-02-03 DIAGNOSIS — M9903 Segmental and somatic dysfunction of lumbar region: Secondary | ICD-10-CM | POA: Diagnosis not present

## 2016-02-03 DIAGNOSIS — M62830 Muscle spasm of back: Secondary | ICD-10-CM | POA: Diagnosis not present

## 2016-02-03 DIAGNOSIS — M545 Low back pain: Secondary | ICD-10-CM | POA: Diagnosis not present

## 2016-02-03 DIAGNOSIS — M5136 Other intervertebral disc degeneration, lumbar region: Secondary | ICD-10-CM | POA: Diagnosis not present

## 2016-02-05 DIAGNOSIS — M9903 Segmental and somatic dysfunction of lumbar region: Secondary | ICD-10-CM | POA: Diagnosis not present

## 2016-02-05 DIAGNOSIS — M5136 Other intervertebral disc degeneration, lumbar region: Secondary | ICD-10-CM | POA: Diagnosis not present

## 2016-02-05 DIAGNOSIS — M5116 Intervertebral disc disorders with radiculopathy, lumbar region: Secondary | ICD-10-CM | POA: Diagnosis not present

## 2016-02-05 DIAGNOSIS — M545 Low back pain: Secondary | ICD-10-CM | POA: Diagnosis not present

## 2016-02-05 DIAGNOSIS — M62830 Muscle spasm of back: Secondary | ICD-10-CM | POA: Diagnosis not present

## 2016-02-08 DIAGNOSIS — M5116 Intervertebral disc disorders with radiculopathy, lumbar region: Secondary | ICD-10-CM | POA: Diagnosis not present

## 2016-02-08 DIAGNOSIS — M9903 Segmental and somatic dysfunction of lumbar region: Secondary | ICD-10-CM | POA: Diagnosis not present

## 2016-02-08 DIAGNOSIS — M62830 Muscle spasm of back: Secondary | ICD-10-CM | POA: Diagnosis not present

## 2016-02-08 DIAGNOSIS — M5136 Other intervertebral disc degeneration, lumbar region: Secondary | ICD-10-CM | POA: Diagnosis not present

## 2016-02-08 DIAGNOSIS — M545 Low back pain: Secondary | ICD-10-CM | POA: Diagnosis not present

## 2016-02-09 DIAGNOSIS — M9903 Segmental and somatic dysfunction of lumbar region: Secondary | ICD-10-CM | POA: Diagnosis not present

## 2016-02-09 DIAGNOSIS — M5136 Other intervertebral disc degeneration, lumbar region: Secondary | ICD-10-CM | POA: Diagnosis not present

## 2016-02-09 DIAGNOSIS — M62830 Muscle spasm of back: Secondary | ICD-10-CM | POA: Diagnosis not present

## 2016-02-09 DIAGNOSIS — M5116 Intervertebral disc disorders with radiculopathy, lumbar region: Secondary | ICD-10-CM | POA: Diagnosis not present

## 2016-02-09 DIAGNOSIS — M545 Low back pain: Secondary | ICD-10-CM | POA: Diagnosis not present

## 2016-02-15 DIAGNOSIS — M62830 Muscle spasm of back: Secondary | ICD-10-CM | POA: Diagnosis not present

## 2016-02-15 DIAGNOSIS — M545 Low back pain: Secondary | ICD-10-CM | POA: Diagnosis not present

## 2016-02-15 DIAGNOSIS — M5136 Other intervertebral disc degeneration, lumbar region: Secondary | ICD-10-CM | POA: Diagnosis not present

## 2016-02-15 DIAGNOSIS — M9903 Segmental and somatic dysfunction of lumbar region: Secondary | ICD-10-CM | POA: Diagnosis not present

## 2016-02-15 DIAGNOSIS — M5116 Intervertebral disc disorders with radiculopathy, lumbar region: Secondary | ICD-10-CM | POA: Diagnosis not present

## 2016-02-17 DIAGNOSIS — M5136 Other intervertebral disc degeneration, lumbar region: Secondary | ICD-10-CM | POA: Diagnosis not present

## 2016-02-17 DIAGNOSIS — M545 Low back pain: Secondary | ICD-10-CM | POA: Diagnosis not present

## 2016-02-17 DIAGNOSIS — M5116 Intervertebral disc disorders with radiculopathy, lumbar region: Secondary | ICD-10-CM | POA: Diagnosis not present

## 2016-02-17 DIAGNOSIS — M62830 Muscle spasm of back: Secondary | ICD-10-CM | POA: Diagnosis not present

## 2016-02-17 DIAGNOSIS — M9903 Segmental and somatic dysfunction of lumbar region: Secondary | ICD-10-CM | POA: Diagnosis not present

## 2016-02-18 DIAGNOSIS — M5116 Intervertebral disc disorders with radiculopathy, lumbar region: Secondary | ICD-10-CM | POA: Diagnosis not present

## 2016-02-18 DIAGNOSIS — M545 Low back pain: Secondary | ICD-10-CM | POA: Diagnosis not present

## 2016-02-18 DIAGNOSIS — M5136 Other intervertebral disc degeneration, lumbar region: Secondary | ICD-10-CM | POA: Diagnosis not present

## 2016-02-18 DIAGNOSIS — M9903 Segmental and somatic dysfunction of lumbar region: Secondary | ICD-10-CM | POA: Diagnosis not present

## 2016-02-18 DIAGNOSIS — M62830 Muscle spasm of back: Secondary | ICD-10-CM | POA: Diagnosis not present

## 2016-02-22 DIAGNOSIS — M5116 Intervertebral disc disorders with radiculopathy, lumbar region: Secondary | ICD-10-CM | POA: Diagnosis not present

## 2016-02-22 DIAGNOSIS — M62830 Muscle spasm of back: Secondary | ICD-10-CM | POA: Diagnosis not present

## 2016-02-22 DIAGNOSIS — M545 Low back pain: Secondary | ICD-10-CM | POA: Diagnosis not present

## 2016-02-22 DIAGNOSIS — M9903 Segmental and somatic dysfunction of lumbar region: Secondary | ICD-10-CM | POA: Diagnosis not present

## 2016-02-22 DIAGNOSIS — M5136 Other intervertebral disc degeneration, lumbar region: Secondary | ICD-10-CM | POA: Diagnosis not present

## 2016-02-25 DIAGNOSIS — M545 Low back pain: Secondary | ICD-10-CM | POA: Diagnosis not present

## 2016-02-25 DIAGNOSIS — M9903 Segmental and somatic dysfunction of lumbar region: Secondary | ICD-10-CM | POA: Diagnosis not present

## 2016-02-25 DIAGNOSIS — M62830 Muscle spasm of back: Secondary | ICD-10-CM | POA: Diagnosis not present

## 2016-02-25 DIAGNOSIS — M5136 Other intervertebral disc degeneration, lumbar region: Secondary | ICD-10-CM | POA: Diagnosis not present

## 2016-02-25 DIAGNOSIS — M5116 Intervertebral disc disorders with radiculopathy, lumbar region: Secondary | ICD-10-CM | POA: Diagnosis not present

## 2016-02-29 DIAGNOSIS — M5136 Other intervertebral disc degeneration, lumbar region: Secondary | ICD-10-CM | POA: Diagnosis not present

## 2016-02-29 DIAGNOSIS — M62830 Muscle spasm of back: Secondary | ICD-10-CM | POA: Diagnosis not present

## 2016-02-29 DIAGNOSIS — M5116 Intervertebral disc disorders with radiculopathy, lumbar region: Secondary | ICD-10-CM | POA: Diagnosis not present

## 2016-02-29 DIAGNOSIS — M9903 Segmental and somatic dysfunction of lumbar region: Secondary | ICD-10-CM | POA: Diagnosis not present

## 2016-02-29 DIAGNOSIS — M545 Low back pain: Secondary | ICD-10-CM | POA: Diagnosis not present

## 2016-03-02 DIAGNOSIS — I10 Essential (primary) hypertension: Secondary | ICD-10-CM | POA: Diagnosis not present

## 2016-03-02 DIAGNOSIS — M159 Polyosteoarthritis, unspecified: Secondary | ICD-10-CM | POA: Diagnosis not present

## 2016-03-03 DIAGNOSIS — M5136 Other intervertebral disc degeneration, lumbar region: Secondary | ICD-10-CM | POA: Diagnosis not present

## 2016-03-03 DIAGNOSIS — M545 Low back pain: Secondary | ICD-10-CM | POA: Diagnosis not present

## 2016-03-03 DIAGNOSIS — M62830 Muscle spasm of back: Secondary | ICD-10-CM | POA: Diagnosis not present

## 2016-03-03 DIAGNOSIS — M9903 Segmental and somatic dysfunction of lumbar region: Secondary | ICD-10-CM | POA: Diagnosis not present

## 2016-03-03 DIAGNOSIS — M5116 Intervertebral disc disorders with radiculopathy, lumbar region: Secondary | ICD-10-CM | POA: Diagnosis not present

## 2016-03-07 DIAGNOSIS — M62830 Muscle spasm of back: Secondary | ICD-10-CM | POA: Diagnosis not present

## 2016-03-07 DIAGNOSIS — M9903 Segmental and somatic dysfunction of lumbar region: Secondary | ICD-10-CM | POA: Diagnosis not present

## 2016-03-07 DIAGNOSIS — M5136 Other intervertebral disc degeneration, lumbar region: Secondary | ICD-10-CM | POA: Diagnosis not present

## 2016-03-07 DIAGNOSIS — M545 Low back pain: Secondary | ICD-10-CM | POA: Diagnosis not present

## 2016-03-07 DIAGNOSIS — M5116 Intervertebral disc disorders with radiculopathy, lumbar region: Secondary | ICD-10-CM | POA: Diagnosis not present

## 2016-03-09 DIAGNOSIS — M5136 Other intervertebral disc degeneration, lumbar region: Secondary | ICD-10-CM | POA: Diagnosis not present

## 2016-03-09 DIAGNOSIS — M62830 Muscle spasm of back: Secondary | ICD-10-CM | POA: Diagnosis not present

## 2016-03-09 DIAGNOSIS — M545 Low back pain: Secondary | ICD-10-CM | POA: Diagnosis not present

## 2016-03-09 DIAGNOSIS — M5116 Intervertebral disc disorders with radiculopathy, lumbar region: Secondary | ICD-10-CM | POA: Diagnosis not present

## 2016-03-09 DIAGNOSIS — M9903 Segmental and somatic dysfunction of lumbar region: Secondary | ICD-10-CM | POA: Diagnosis not present

## 2016-04-08 DIAGNOSIS — R339 Retention of urine, unspecified: Secondary | ICD-10-CM | POA: Diagnosis not present

## 2016-04-08 DIAGNOSIS — N401 Enlarged prostate with lower urinary tract symptoms: Secondary | ICD-10-CM | POA: Diagnosis not present

## 2016-04-08 DIAGNOSIS — E291 Testicular hypofunction: Secondary | ICD-10-CM | POA: Diagnosis not present

## 2016-04-08 DIAGNOSIS — N138 Other obstructive and reflux uropathy: Secondary | ICD-10-CM | POA: Diagnosis not present

## 2016-04-08 DIAGNOSIS — T387X5D Adverse effect of androgens and anabolic congeners, subsequent encounter: Secondary | ICD-10-CM | POA: Diagnosis not present

## 2016-04-08 DIAGNOSIS — R358 Other polyuria: Secondary | ICD-10-CM | POA: Diagnosis not present

## 2016-04-29 DIAGNOSIS — N138 Other obstructive and reflux uropathy: Secondary | ICD-10-CM | POA: Diagnosis not present

## 2016-04-29 DIAGNOSIS — N401 Enlarged prostate with lower urinary tract symptoms: Secondary | ICD-10-CM | POA: Diagnosis not present

## 2016-04-29 DIAGNOSIS — E291 Testicular hypofunction: Secondary | ICD-10-CM | POA: Diagnosis not present

## 2016-06-10 DIAGNOSIS — E291 Testicular hypofunction: Secondary | ICD-10-CM | POA: Diagnosis not present

## 2016-06-10 DIAGNOSIS — N138 Other obstructive and reflux uropathy: Secondary | ICD-10-CM | POA: Diagnosis not present

## 2016-06-10 DIAGNOSIS — R358 Other polyuria: Secondary | ICD-10-CM | POA: Diagnosis not present

## 2016-06-10 DIAGNOSIS — T387X5D Adverse effect of androgens and anabolic congeners, subsequent encounter: Secondary | ICD-10-CM | POA: Diagnosis not present

## 2016-06-10 DIAGNOSIS — N401 Enlarged prostate with lower urinary tract symptoms: Secondary | ICD-10-CM | POA: Diagnosis not present

## 2016-07-06 DIAGNOSIS — Z6825 Body mass index (BMI) 25.0-25.9, adult: Secondary | ICD-10-CM | POA: Diagnosis not present

## 2016-07-06 DIAGNOSIS — I10 Essential (primary) hypertension: Secondary | ICD-10-CM | POA: Diagnosis not present

## 2016-07-06 DIAGNOSIS — M1990 Unspecified osteoarthritis, unspecified site: Secondary | ICD-10-CM | POA: Diagnosis not present

## 2016-07-06 DIAGNOSIS — E782 Mixed hyperlipidemia: Secondary | ICD-10-CM | POA: Diagnosis not present

## 2016-07-06 DIAGNOSIS — Z13818 Encounter for screening for other digestive system disorders: Secondary | ICD-10-CM | POA: Diagnosis not present

## 2016-07-07 DIAGNOSIS — M5417 Radiculopathy, lumbosacral region: Secondary | ICD-10-CM | POA: Diagnosis not present

## 2016-07-07 DIAGNOSIS — M9903 Segmental and somatic dysfunction of lumbar region: Secondary | ICD-10-CM | POA: Diagnosis not present

## 2016-07-14 DIAGNOSIS — M5417 Radiculopathy, lumbosacral region: Secondary | ICD-10-CM | POA: Diagnosis not present

## 2016-07-14 DIAGNOSIS — M9903 Segmental and somatic dysfunction of lumbar region: Secondary | ICD-10-CM | POA: Diagnosis not present

## 2016-08-08 DIAGNOSIS — M9903 Segmental and somatic dysfunction of lumbar region: Secondary | ICD-10-CM | POA: Diagnosis not present

## 2016-08-08 DIAGNOSIS — M5417 Radiculopathy, lumbosacral region: Secondary | ICD-10-CM | POA: Diagnosis not present

## 2016-08-12 DIAGNOSIS — T387X5D Adverse effect of androgens and anabolic congeners, subsequent encounter: Secondary | ICD-10-CM | POA: Diagnosis not present

## 2016-08-12 DIAGNOSIS — N401 Enlarged prostate with lower urinary tract symptoms: Secondary | ICD-10-CM | POA: Diagnosis not present

## 2016-08-12 DIAGNOSIS — E291 Testicular hypofunction: Secondary | ICD-10-CM | POA: Diagnosis not present

## 2016-08-12 DIAGNOSIS — N138 Other obstructive and reflux uropathy: Secondary | ICD-10-CM | POA: Diagnosis not present

## 2016-08-12 DIAGNOSIS — R358 Other polyuria: Secondary | ICD-10-CM | POA: Diagnosis not present

## 2016-09-08 DIAGNOSIS — M9903 Segmental and somatic dysfunction of lumbar region: Secondary | ICD-10-CM | POA: Diagnosis not present

## 2016-09-08 DIAGNOSIS — M5417 Radiculopathy, lumbosacral region: Secondary | ICD-10-CM | POA: Diagnosis not present

## 2016-10-06 DIAGNOSIS — M5417 Radiculopathy, lumbosacral region: Secondary | ICD-10-CM | POA: Diagnosis not present

## 2016-10-06 DIAGNOSIS — M9903 Segmental and somatic dysfunction of lumbar region: Secondary | ICD-10-CM | POA: Diagnosis not present

## 2016-10-21 DIAGNOSIS — N401 Enlarged prostate with lower urinary tract symptoms: Secondary | ICD-10-CM | POA: Diagnosis not present

## 2016-10-21 DIAGNOSIS — N138 Other obstructive and reflux uropathy: Secondary | ICD-10-CM | POA: Diagnosis not present

## 2016-10-21 DIAGNOSIS — E291 Testicular hypofunction: Secondary | ICD-10-CM | POA: Diagnosis not present

## 2016-11-03 DIAGNOSIS — E782 Mixed hyperlipidemia: Secondary | ICD-10-CM | POA: Diagnosis not present

## 2016-11-03 DIAGNOSIS — I10 Essential (primary) hypertension: Secondary | ICD-10-CM | POA: Diagnosis not present

## 2016-11-03 DIAGNOSIS — M159 Polyosteoarthritis, unspecified: Secondary | ICD-10-CM | POA: Diagnosis not present

## 2016-11-07 DIAGNOSIS — Z0000 Encounter for general adult medical examination without abnormal findings: Secondary | ICD-10-CM | POA: Diagnosis not present

## 2016-11-18 DIAGNOSIS — T387X5D Adverse effect of androgens and anabolic congeners, subsequent encounter: Secondary | ICD-10-CM | POA: Diagnosis not present

## 2016-11-18 DIAGNOSIS — R358 Other polyuria: Secondary | ICD-10-CM | POA: Diagnosis not present

## 2016-11-18 DIAGNOSIS — N401 Enlarged prostate with lower urinary tract symptoms: Secondary | ICD-10-CM | POA: Diagnosis not present

## 2016-11-18 DIAGNOSIS — E291 Testicular hypofunction: Secondary | ICD-10-CM | POA: Diagnosis not present

## 2016-11-18 DIAGNOSIS — N138 Other obstructive and reflux uropathy: Secondary | ICD-10-CM | POA: Diagnosis not present

## 2016-12-08 DIAGNOSIS — M9903 Segmental and somatic dysfunction of lumbar region: Secondary | ICD-10-CM | POA: Diagnosis not present

## 2016-12-08 DIAGNOSIS — M5417 Radiculopathy, lumbosacral region: Secondary | ICD-10-CM | POA: Diagnosis not present

## 2017-01-19 DIAGNOSIS — M5116 Intervertebral disc disorders with radiculopathy, lumbar region: Secondary | ICD-10-CM | POA: Diagnosis not present

## 2017-01-19 DIAGNOSIS — M9903 Segmental and somatic dysfunction of lumbar region: Secondary | ICD-10-CM | POA: Diagnosis not present

## 2017-01-19 DIAGNOSIS — M62830 Muscle spasm of back: Secondary | ICD-10-CM | POA: Diagnosis not present

## 2017-01-27 DIAGNOSIS — N401 Enlarged prostate with lower urinary tract symptoms: Secondary | ICD-10-CM | POA: Diagnosis not present

## 2017-01-27 DIAGNOSIS — N138 Other obstructive and reflux uropathy: Secondary | ICD-10-CM | POA: Diagnosis not present

## 2017-01-27 DIAGNOSIS — R339 Retention of urine, unspecified: Secondary | ICD-10-CM | POA: Diagnosis not present

## 2017-02-16 DIAGNOSIS — M5116 Intervertebral disc disorders with radiculopathy, lumbar region: Secondary | ICD-10-CM | POA: Diagnosis not present

## 2017-02-16 DIAGNOSIS — M62830 Muscle spasm of back: Secondary | ICD-10-CM | POA: Diagnosis not present

## 2017-02-16 DIAGNOSIS — M9903 Segmental and somatic dysfunction of lumbar region: Secondary | ICD-10-CM | POA: Diagnosis not present

## 2017-03-09 DIAGNOSIS — M1990 Unspecified osteoarthritis, unspecified site: Secondary | ICD-10-CM | POA: Diagnosis not present

## 2017-03-09 DIAGNOSIS — R7309 Other abnormal glucose: Secondary | ICD-10-CM | POA: Diagnosis not present

## 2017-03-09 DIAGNOSIS — I10 Essential (primary) hypertension: Secondary | ICD-10-CM | POA: Diagnosis not present

## 2017-03-23 DIAGNOSIS — M5116 Intervertebral disc disorders with radiculopathy, lumbar region: Secondary | ICD-10-CM | POA: Diagnosis not present

## 2017-03-23 DIAGNOSIS — M62830 Muscle spasm of back: Secondary | ICD-10-CM | POA: Diagnosis not present

## 2017-03-23 DIAGNOSIS — M9903 Segmental and somatic dysfunction of lumbar region: Secondary | ICD-10-CM | POA: Diagnosis not present

## 2017-03-30 DIAGNOSIS — E291 Testicular hypofunction: Secondary | ICD-10-CM | POA: Diagnosis not present

## 2017-03-30 DIAGNOSIS — N138 Other obstructive and reflux uropathy: Secondary | ICD-10-CM | POA: Diagnosis not present

## 2017-03-30 DIAGNOSIS — N401 Enlarged prostate with lower urinary tract symptoms: Secondary | ICD-10-CM | POA: Diagnosis not present

## 2017-04-05 DIAGNOSIS — M62830 Muscle spasm of back: Secondary | ICD-10-CM | POA: Diagnosis not present

## 2017-04-05 DIAGNOSIS — M9903 Segmental and somatic dysfunction of lumbar region: Secondary | ICD-10-CM | POA: Diagnosis not present

## 2017-04-05 DIAGNOSIS — M5116 Intervertebral disc disorders with radiculopathy, lumbar region: Secondary | ICD-10-CM | POA: Diagnosis not present

## 2017-05-04 DIAGNOSIS — R358 Other polyuria: Secondary | ICD-10-CM | POA: Diagnosis not present

## 2017-05-04 DIAGNOSIS — E291 Testicular hypofunction: Secondary | ICD-10-CM | POA: Diagnosis not present

## 2017-05-04 DIAGNOSIS — N401 Enlarged prostate with lower urinary tract symptoms: Secondary | ICD-10-CM | POA: Diagnosis not present

## 2017-05-04 DIAGNOSIS — T387X5D Adverse effect of androgens and anabolic congeners, subsequent encounter: Secondary | ICD-10-CM | POA: Diagnosis not present

## 2017-05-04 DIAGNOSIS — N138 Other obstructive and reflux uropathy: Secondary | ICD-10-CM | POA: Diagnosis not present

## 2017-07-17 DIAGNOSIS — I10 Essential (primary) hypertension: Secondary | ICD-10-CM | POA: Diagnosis not present

## 2017-08-04 DIAGNOSIS — N138 Other obstructive and reflux uropathy: Secondary | ICD-10-CM | POA: Diagnosis not present

## 2017-08-04 DIAGNOSIS — N401 Enlarged prostate with lower urinary tract symptoms: Secondary | ICD-10-CM | POA: Diagnosis not present

## 2017-08-09 DIAGNOSIS — E782 Mixed hyperlipidemia: Secondary | ICD-10-CM | POA: Diagnosis not present

## 2017-08-09 DIAGNOSIS — Z6824 Body mass index (BMI) 24.0-24.9, adult: Secondary | ICD-10-CM | POA: Diagnosis not present

## 2017-08-09 DIAGNOSIS — I10 Essential (primary) hypertension: Secondary | ICD-10-CM | POA: Diagnosis not present

## 2017-08-09 DIAGNOSIS — R7309 Other abnormal glucose: Secondary | ICD-10-CM | POA: Diagnosis not present

## 2017-10-02 DIAGNOSIS — E291 Testicular hypofunction: Secondary | ICD-10-CM | POA: Diagnosis not present

## 2017-10-02 DIAGNOSIS — N138 Other obstructive and reflux uropathy: Secondary | ICD-10-CM | POA: Diagnosis not present

## 2017-10-02 DIAGNOSIS — N401 Enlarged prostate with lower urinary tract symptoms: Secondary | ICD-10-CM | POA: Diagnosis not present

## 2017-12-20 DIAGNOSIS — M9903 Segmental and somatic dysfunction of lumbar region: Secondary | ICD-10-CM | POA: Diagnosis not present

## 2017-12-20 DIAGNOSIS — M62830 Muscle spasm of back: Secondary | ICD-10-CM | POA: Diagnosis not present

## 2017-12-20 DIAGNOSIS — M5116 Intervertebral disc disorders with radiculopathy, lumbar region: Secondary | ICD-10-CM | POA: Diagnosis not present

## 2018-01-08 DIAGNOSIS — M159 Polyosteoarthritis, unspecified: Secondary | ICD-10-CM | POA: Diagnosis not present

## 2018-01-08 DIAGNOSIS — R7309 Other abnormal glucose: Secondary | ICD-10-CM | POA: Diagnosis not present

## 2018-01-08 DIAGNOSIS — E782 Mixed hyperlipidemia: Secondary | ICD-10-CM | POA: Diagnosis not present

## 2018-01-08 DIAGNOSIS — Z6825 Body mass index (BMI) 25.0-25.9, adult: Secondary | ICD-10-CM | POA: Diagnosis not present

## 2018-01-08 DIAGNOSIS — I10 Essential (primary) hypertension: Secondary | ICD-10-CM | POA: Diagnosis not present

## 2018-01-08 DIAGNOSIS — M1990 Unspecified osteoarthritis, unspecified site: Secondary | ICD-10-CM | POA: Diagnosis not present

## 2018-01-10 DIAGNOSIS — M9903 Segmental and somatic dysfunction of lumbar region: Secondary | ICD-10-CM | POA: Diagnosis not present

## 2018-01-10 DIAGNOSIS — M62830 Muscle spasm of back: Secondary | ICD-10-CM | POA: Diagnosis not present

## 2018-01-10 DIAGNOSIS — M5116 Intervertebral disc disorders with radiculopathy, lumbar region: Secondary | ICD-10-CM | POA: Diagnosis not present

## 2018-01-16 DIAGNOSIS — E291 Testicular hypofunction: Secondary | ICD-10-CM | POA: Diagnosis not present

## 2018-01-16 DIAGNOSIS — N401 Enlarged prostate with lower urinary tract symptoms: Secondary | ICD-10-CM | POA: Diagnosis not present

## 2018-01-16 DIAGNOSIS — T387X5D Adverse effect of androgens and anabolic congeners, subsequent encounter: Secondary | ICD-10-CM | POA: Diagnosis not present

## 2018-01-16 DIAGNOSIS — N138 Other obstructive and reflux uropathy: Secondary | ICD-10-CM | POA: Diagnosis not present

## 2018-01-16 DIAGNOSIS — R358 Other polyuria: Secondary | ICD-10-CM | POA: Diagnosis not present

## 2018-02-02 ENCOUNTER — Other Ambulatory Visit: Payer: Self-pay

## 2018-02-12 DIAGNOSIS — M9903 Segmental and somatic dysfunction of lumbar region: Secondary | ICD-10-CM | POA: Diagnosis not present

## 2018-02-12 DIAGNOSIS — M62830 Muscle spasm of back: Secondary | ICD-10-CM | POA: Diagnosis not present

## 2018-02-12 DIAGNOSIS — M5116 Intervertebral disc disorders with radiculopathy, lumbar region: Secondary | ICD-10-CM | POA: Diagnosis not present

## 2018-02-13 DIAGNOSIS — Z0000 Encounter for general adult medical examination without abnormal findings: Secondary | ICD-10-CM | POA: Diagnosis not present

## 2018-02-27 DIAGNOSIS — E291 Testicular hypofunction: Secondary | ICD-10-CM | POA: Diagnosis not present

## 2018-02-27 DIAGNOSIS — N401 Enlarged prostate with lower urinary tract symptoms: Secondary | ICD-10-CM | POA: Diagnosis not present

## 2018-02-27 DIAGNOSIS — N138 Other obstructive and reflux uropathy: Secondary | ICD-10-CM | POA: Diagnosis not present

## 2018-02-28 DIAGNOSIS — Z23 Encounter for immunization: Secondary | ICD-10-CM | POA: Diagnosis not present

## 2018-06-05 DIAGNOSIS — N138 Other obstructive and reflux uropathy: Secondary | ICD-10-CM | POA: Diagnosis not present

## 2018-06-05 DIAGNOSIS — R358 Other polyuria: Secondary | ICD-10-CM | POA: Diagnosis not present

## 2018-06-05 DIAGNOSIS — T387X5D Adverse effect of androgens and anabolic congeners, subsequent encounter: Secondary | ICD-10-CM | POA: Diagnosis not present

## 2018-06-05 DIAGNOSIS — E291 Testicular hypofunction: Secondary | ICD-10-CM | POA: Diagnosis not present

## 2018-06-05 DIAGNOSIS — N401 Enlarged prostate with lower urinary tract symptoms: Secondary | ICD-10-CM | POA: Diagnosis not present

## 2018-06-08 DIAGNOSIS — I10 Essential (primary) hypertension: Secondary | ICD-10-CM | POA: Diagnosis not present

## 2018-06-08 DIAGNOSIS — R7309 Other abnormal glucose: Secondary | ICD-10-CM | POA: Diagnosis not present

## 2018-06-08 DIAGNOSIS — E782 Mixed hyperlipidemia: Secondary | ICD-10-CM | POA: Diagnosis not present

## 2018-06-08 DIAGNOSIS — Z6825 Body mass index (BMI) 25.0-25.9, adult: Secondary | ICD-10-CM | POA: Diagnosis not present

## 2018-06-08 DIAGNOSIS — M1990 Unspecified osteoarthritis, unspecified site: Secondary | ICD-10-CM | POA: Diagnosis not present

## 2018-06-12 DIAGNOSIS — M25512 Pain in left shoulder: Secondary | ICD-10-CM | POA: Diagnosis not present

## 2018-06-14 DIAGNOSIS — M25512 Pain in left shoulder: Secondary | ICD-10-CM | POA: Diagnosis not present

## 2018-06-19 DIAGNOSIS — M25512 Pain in left shoulder: Secondary | ICD-10-CM | POA: Diagnosis not present

## 2018-06-21 DIAGNOSIS — M25512 Pain in left shoulder: Secondary | ICD-10-CM | POA: Diagnosis not present

## 2018-07-06 DIAGNOSIS — E782 Mixed hyperlipidemia: Secondary | ICD-10-CM | POA: Diagnosis not present

## 2018-07-06 DIAGNOSIS — I10 Essential (primary) hypertension: Secondary | ICD-10-CM | POA: Diagnosis not present

## 2018-07-12 DIAGNOSIS — M25512 Pain in left shoulder: Secondary | ICD-10-CM | POA: Diagnosis not present

## 2018-07-17 DIAGNOSIS — M25512 Pain in left shoulder: Secondary | ICD-10-CM | POA: Diagnosis not present

## 2018-07-19 DIAGNOSIS — M25512 Pain in left shoulder: Secondary | ICD-10-CM | POA: Diagnosis not present

## 2018-07-26 DIAGNOSIS — M25512 Pain in left shoulder: Secondary | ICD-10-CM | POA: Diagnosis not present

## 2018-07-30 DIAGNOSIS — M25512 Pain in left shoulder: Secondary | ICD-10-CM | POA: Diagnosis not present

## 2018-08-01 DIAGNOSIS — M25512 Pain in left shoulder: Secondary | ICD-10-CM | POA: Diagnosis not present

## 2018-11-05 DIAGNOSIS — M159 Polyosteoarthritis, unspecified: Secondary | ICD-10-CM | POA: Diagnosis not present

## 2018-11-05 DIAGNOSIS — R739 Hyperglycemia, unspecified: Secondary | ICD-10-CM | POA: Diagnosis not present

## 2018-11-05 DIAGNOSIS — R7309 Other abnormal glucose: Secondary | ICD-10-CM | POA: Diagnosis not present

## 2018-11-05 DIAGNOSIS — I10 Essential (primary) hypertension: Secondary | ICD-10-CM | POA: Diagnosis not present

## 2018-11-22 DIAGNOSIS — Z23 Encounter for immunization: Secondary | ICD-10-CM | POA: Diagnosis not present

## 2018-12-20 DIAGNOSIS — E291 Testicular hypofunction: Secondary | ICD-10-CM | POA: Diagnosis not present

## 2018-12-20 DIAGNOSIS — N138 Other obstructive and reflux uropathy: Secondary | ICD-10-CM | POA: Diagnosis not present

## 2018-12-20 DIAGNOSIS — N401 Enlarged prostate with lower urinary tract symptoms: Secondary | ICD-10-CM | POA: Diagnosis not present

## 2019-03-07 ENCOUNTER — Other Ambulatory Visit: Payer: Self-pay | Admitting: Family Medicine

## 2019-03-07 DIAGNOSIS — K8001 Calculus of gallbladder with acute cholecystitis with obstruction: Secondary | ICD-10-CM

## 2019-03-13 ENCOUNTER — Ambulatory Visit
Admission: RE | Admit: 2019-03-13 | Discharge: 2019-03-13 | Disposition: A | Discharge status: Home / Self Care | Payer: Medicare Other | Source: Ambulatory Visit | Attending: Family Medicine | Admitting: Family Medicine

## 2019-03-13 DIAGNOSIS — K8001 Calculus of gallbladder with acute cholecystitis with obstruction: Secondary | ICD-10-CM

## 2019-03-28 DIAGNOSIS — N401 Enlarged prostate with lower urinary tract symptoms: Secondary | ICD-10-CM | POA: Diagnosis not present

## 2019-03-28 DIAGNOSIS — N138 Other obstructive and reflux uropathy: Secondary | ICD-10-CM | POA: Diagnosis not present

## 2019-04-11 ENCOUNTER — Ambulatory Visit: Payer: Medicare Other | Attending: Internal Medicine

## 2019-04-11 DIAGNOSIS — Z20822 Contact with and (suspected) exposure to covid-19: Secondary | ICD-10-CM | POA: Diagnosis not present

## 2019-04-12 LAB — NOVEL CORONAVIRUS, NAA: SARS-CoV-2, NAA: DETECTED — AB

## 2019-04-13 ENCOUNTER — Other Ambulatory Visit: Payer: Self-pay | Admitting: Adult Health

## 2019-04-13 DIAGNOSIS — U071 COVID-19: Secondary | ICD-10-CM

## 2019-04-13 NOTE — Progress Notes (Signed)
  I connected by phone with Theodore Wilson on 04/13/2019 at 6:20 PM to discuss the potential use of an new treatment for mild to moderate COVID-19 viral infection in non-hospitalized patients.  This patient is a 79 y.o. male that meets the FDA criteria for Emergency Use Authorization of bamlanivimab or casirivimab\imdevimab.  Has a (+) direct SARS-CoV-2 viral test result  Has mild or moderate COVID-19   Is ? 76 years of age and weighs ? 40 kg  Is NOT hospitalized due to COVID-19  Is NOT requiring oxygen therapy or requiring an increase in baseline oxygen flow rate due to COVID-19  Is within 10 days of symptom onset  Has at least one of the high risk factor(s) for progression to severe COVID-19 and/or hospitalization as defined in EUA.  Specific high risk criteria : >/= 77 yo   I have spoken and communicated the following to the patient or parent/caregiver:  1. FDA has authorized the emergency use of bamlanivimab and casirivimab\imdevimab for the treatment of mild to moderate COVID-19 in adults and pediatric patients with positive results of direct SARS-CoV-2 viral testing who are 51 years of age and older weighing at least 40 kg, and who are at high risk for progressing to severe COVID-19 and/or hospitalization.  2. The significant known and potential risks and benefits of bamlanivimab and casirivimab\imdevimab, and the extent to which such potential risks and benefits are unknown.  3. Information on available alternative treatments and the risks and benefits of those alternatives, including clinical trials.  4. Patients treated with bamlanivimab and casirivimab\imdevimab should continue to self-isolate and use infection control measures (e.g., wear mask, isolate, social distance, avoid sharing personal items, clean and disinfect "high touch" surfaces, and frequent handwashing) according to CDC guidelines.   5. The patient or parent/caregiver has the option to accept or refuse  bamlanivimab or casirivimab\imdevimab .  After reviewing this information with the patient, The patient agreed to proceed with receiving the bamlanimivab infusion and will be provided a copy of the Fact sheet prior to receiving the infusion.Theodore Wilson 04/13/2019 6:20 PM

## 2019-04-17 ENCOUNTER — Ambulatory Visit (HOSPITAL_COMMUNITY)
Admission: RE | Admit: 2019-04-17 | Discharge: 2019-04-17 | Disposition: A | Discharge status: Home / Self Care | Payer: Medicare Other | Source: Ambulatory Visit | Attending: Pulmonary Disease | Admitting: Pulmonary Disease

## 2019-04-17 DIAGNOSIS — Z23 Encounter for immunization: Secondary | ICD-10-CM | POA: Diagnosis not present

## 2019-04-17 DIAGNOSIS — U071 COVID-19: Secondary | ICD-10-CM | POA: Insufficient documentation

## 2019-04-17 MED ORDER — FAMOTIDINE IN NACL 20-0.9 MG/50ML-% IV SOLN: 20.0000 mg | Freq: Once | INTRAVENOUS | Status: DC | PRN

## 2019-04-17 MED ORDER — BAMLANIVIMAB 700MG/20ML INJECTION
700.0000 mg | Freq: Once | INTRAMUSCULAR | Status: AC
  Administered 2019-04-17: 700 mg via INTRAVENOUS
  Filled 2019-04-17: qty 20

## 2019-04-17 MED ORDER — DIPHENHYDRAMINE HCL 50 MG/ML IJ SOLN: 50.0000 mg | Freq: Once | INTRAMUSCULAR | Status: DC | PRN

## 2019-04-17 MED ORDER — SODIUM CHLORIDE 0.9 % IV SOLN
INTRAVENOUS | Status: DC | PRN
  Administered 2019-04-17: 15:00:00 250 mL via INTRAVENOUS

## 2019-04-17 MED ORDER — EPINEPHRINE 0.3 MG/0.3ML IJ SOAJ: 0.3000 mg | Freq: Once | INTRAMUSCULAR | Status: DC | PRN

## 2019-04-17 MED ORDER — ALBUTEROL SULFATE HFA 108 (90 BASE) MCG/ACT IN AERS: 2.0000 | INHALATION_SPRAY | Freq: Once | RESPIRATORY_TRACT | Status: DC | PRN

## 2019-04-17 MED ORDER — METHYLPREDNISOLONE SODIUM SUCC 125 MG IJ SOLR: 125.0000 mg | Freq: Once | INTRAMUSCULAR | Status: DC | PRN

## 2019-04-17 NOTE — Discharge Instructions (Signed)
10 Things You Can Do to Manage Your COVID-19 Symptoms at Home If you have possible or confirmed COVID-19: 1. Stay home from work and school. And stay away from other public places. If you must go out, avoid using any kind of public transportation, ridesharing, or taxis. 2. Monitor your symptoms carefully. If your symptoms get worse, call your healthcare provider immediately. 3. Get rest and stay hydrated. 4. If you have a medical appointment, call the healthcare provider ahead of time and tell them that you have or may have COVID-19. 5. For medical emergencies, call 911 and notify the dispatch personnel that you have or may have COVID-19. 6. Cover your cough and sneezes with a tissue or use the inside of your elbow. 7. Wash your hands often with soap and water for at least 20 seconds or clean your hands with an alcohol-based hand sanitizer that contains at least 60% alcohol. 8. As much as possible, stay in a specific room and away from other people in your home. Also, you should use a separate bathroom, if available. If you need to be around other people in or outside of the home, wear a mask. 9. Avoid sharing personal items with other people in your household, like dishes, towels, and bedding. 10. Clean all surfaces that are touched often, like counters, tabletops, and doorknobs. Use household cleaning sprays or wipes according to the label instructions. cdc.gov/coronavirus 09/19/2018 This information is not intended to replace advice given to you by your health care provider. Make sure you discuss any questions you have with your health care provider. Document Revised: 02/21/2019 Document Reviewed: 02/21/2019 Elsevier Patient Education  2020 Elsevier Inc.  

## 2019-04-17 NOTE — Progress Notes (Signed)
  Diagnosis: COVID-19  Physician: Patrick Wright  Procedure: [facility] Med: bamlanivimab infusion - Provided patient with bamlanimivab fact sheet for patients, parents and caregivers prior to infusion.  Complications: No immediate complications noted.  Discharge: Discharged home   Soraya Paquette C 04/17/2019   

## 2019-06-27 DIAGNOSIS — N401 Enlarged prostate with lower urinary tract symptoms: Secondary | ICD-10-CM | POA: Diagnosis not present

## 2019-06-27 DIAGNOSIS — N138 Other obstructive and reflux uropathy: Secondary | ICD-10-CM | POA: Diagnosis not present

## 2019-06-27 DIAGNOSIS — E291 Testicular hypofunction: Secondary | ICD-10-CM | POA: Diagnosis not present

## 2019-07-25 DIAGNOSIS — Z6824 Body mass index (BMI) 24.0-24.9, adult: Secondary | ICD-10-CM | POA: Diagnosis not present

## 2019-07-25 DIAGNOSIS — I10 Essential (primary) hypertension: Secondary | ICD-10-CM | POA: Diagnosis not present

## 2019-07-25 DIAGNOSIS — Z8616 Personal history of COVID-19: Secondary | ICD-10-CM | POA: Diagnosis not present

## 2019-07-25 DIAGNOSIS — R739 Hyperglycemia, unspecified: Secondary | ICD-10-CM | POA: Diagnosis not present

## 2019-07-25 DIAGNOSIS — M159 Polyosteoarthritis, unspecified: Secondary | ICD-10-CM | POA: Diagnosis not present

## 2019-09-26 DIAGNOSIS — E291 Testicular hypofunction: Secondary | ICD-10-CM | POA: Diagnosis not present

## 2019-09-26 DIAGNOSIS — N401 Enlarged prostate with lower urinary tract symptoms: Secondary | ICD-10-CM | POA: Diagnosis not present

## 2019-09-26 DIAGNOSIS — R358 Other polyuria: Secondary | ICD-10-CM | POA: Diagnosis not present

## 2019-09-26 DIAGNOSIS — N138 Other obstructive and reflux uropathy: Secondary | ICD-10-CM | POA: Diagnosis not present

## 2019-09-26 DIAGNOSIS — T387X5D Adverse effect of androgens and anabolic congeners, subsequent encounter: Secondary | ICD-10-CM | POA: Diagnosis not present

## 2019-11-26 ENCOUNTER — Other Ambulatory Visit (HOSPITAL_COMMUNITY): Payer: Self-pay | Admitting: Family Medicine

## 2019-11-26 DIAGNOSIS — R5383 Other fatigue: Secondary | ICD-10-CM | POA: Diagnosis not present

## 2019-11-26 DIAGNOSIS — R634 Abnormal weight loss: Secondary | ICD-10-CM | POA: Diagnosis not present

## 2019-11-26 DIAGNOSIS — R7309 Other abnormal glucose: Secondary | ICD-10-CM | POA: Diagnosis not present

## 2019-11-26 DIAGNOSIS — Z8616 Personal history of COVID-19: Secondary | ICD-10-CM | POA: Diagnosis not present

## 2019-11-26 DIAGNOSIS — R9431 Abnormal electrocardiogram [ECG] [EKG]: Secondary | ICD-10-CM

## 2019-11-26 DIAGNOSIS — I10 Essential (primary) hypertension: Secondary | ICD-10-CM | POA: Diagnosis not present

## 2019-11-26 DIAGNOSIS — R008 Other abnormalities of heart beat: Secondary | ICD-10-CM | POA: Diagnosis not present

## 2019-11-26 DIAGNOSIS — Z6823 Body mass index (BMI) 23.0-23.9, adult: Secondary | ICD-10-CM | POA: Diagnosis not present

## 2019-11-27 ENCOUNTER — Other Ambulatory Visit: Payer: Self-pay

## 2019-11-27 ENCOUNTER — Ambulatory Visit (HOSPITAL_COMMUNITY)
Admission: RE | Admit: 2019-11-27 | Discharge: 2019-11-27 | Disposition: A | Discharge status: Home / Self Care | Payer: Medicare Other | Source: Ambulatory Visit | Attending: Family Medicine | Admitting: Family Medicine

## 2019-11-27 DIAGNOSIS — R9431 Abnormal electrocardiogram [ECG] [EKG]: Secondary | ICD-10-CM | POA: Insufficient documentation

## 2019-11-27 DIAGNOSIS — I493 Ventricular premature depolarization: Secondary | ICD-10-CM | POA: Diagnosis not present

## 2019-11-27 DIAGNOSIS — I081 Rheumatic disorders of both mitral and tricuspid valves: Secondary | ICD-10-CM | POA: Diagnosis not present

## 2019-11-27 LAB — ECHOCARDIOGRAM COMPLETE
Area-P 1/2: 2.73 cm2
Calc EF: 48.6 %
S' Lateral: 2.8 cm
Single Plane A2C EF: 54.3 %
Single Plane A4C EF: 47.5 %

## 2019-11-27 NOTE — Progress Notes (Signed)
Echocardiogram 2D Echocardiogram has been performed.  Augustine Radar 11/27/2019, 11:06 AM

## 2019-11-29 DIAGNOSIS — R008 Other abnormalities of heart beat: Secondary | ICD-10-CM | POA: Diagnosis not present

## 2019-12-17 DIAGNOSIS — Z23 Encounter for immunization: Secondary | ICD-10-CM | POA: Diagnosis not present

## 2019-12-19 DIAGNOSIS — M1990 Unspecified osteoarthritis, unspecified site: Secondary | ICD-10-CM | POA: Diagnosis not present

## 2019-12-19 DIAGNOSIS — E7849 Other hyperlipidemia: Secondary | ICD-10-CM | POA: Diagnosis not present

## 2019-12-19 DIAGNOSIS — I10 Essential (primary) hypertension: Secondary | ICD-10-CM | POA: Diagnosis not present

## 2019-12-26 DIAGNOSIS — N401 Enlarged prostate with lower urinary tract symptoms: Secondary | ICD-10-CM | POA: Diagnosis not present

## 2019-12-26 DIAGNOSIS — E291 Testicular hypofunction: Secondary | ICD-10-CM | POA: Diagnosis not present

## 2019-12-26 DIAGNOSIS — N138 Other obstructive and reflux uropathy: Secondary | ICD-10-CM | POA: Diagnosis not present

## 2020-01-21 DIAGNOSIS — I10 Essential (primary) hypertension: Secondary | ICD-10-CM | POA: Diagnosis not present

## 2020-01-21 DIAGNOSIS — M1990 Unspecified osteoarthritis, unspecified site: Secondary | ICD-10-CM | POA: Diagnosis not present

## 2020-02-15 DIAGNOSIS — Z23 Encounter for immunization: Secondary | ICD-10-CM | POA: Diagnosis not present

## 2020-03-26 DIAGNOSIS — N401 Enlarged prostate with lower urinary tract symptoms: Secondary | ICD-10-CM | POA: Diagnosis not present

## 2020-03-26 DIAGNOSIS — N138 Other obstructive and reflux uropathy: Secondary | ICD-10-CM | POA: Diagnosis not present

## 2020-03-26 DIAGNOSIS — R3589 Other polyuria: Secondary | ICD-10-CM | POA: Diagnosis not present

## 2020-03-26 DIAGNOSIS — T387X5D Adverse effect of androgens and anabolic congeners, subsequent encounter: Secondary | ICD-10-CM | POA: Diagnosis not present

## 2020-03-26 DIAGNOSIS — E291 Testicular hypofunction: Secondary | ICD-10-CM | POA: Diagnosis not present

## 2020-05-18 DIAGNOSIS — R7309 Other abnormal glucose: Secondary | ICD-10-CM | POA: Diagnosis not present

## 2020-05-18 DIAGNOSIS — E785 Hyperlipidemia, unspecified: Secondary | ICD-10-CM | POA: Diagnosis not present

## 2020-05-18 DIAGNOSIS — I10 Essential (primary) hypertension: Secondary | ICD-10-CM | POA: Diagnosis not present

## 2020-05-21 DIAGNOSIS — R5383 Other fatigue: Secondary | ICD-10-CM | POA: Diagnosis not present

## 2020-05-21 DIAGNOSIS — R197 Diarrhea, unspecified: Secondary | ICD-10-CM | POA: Diagnosis not present

## 2020-05-21 DIAGNOSIS — R634 Abnormal weight loss: Secondary | ICD-10-CM | POA: Diagnosis not present

## 2020-05-22 DIAGNOSIS — R634 Abnormal weight loss: Secondary | ICD-10-CM | POA: Diagnosis not present

## 2020-05-22 DIAGNOSIS — R197 Diarrhea, unspecified: Secondary | ICD-10-CM | POA: Diagnosis not present

## 2020-05-22 DIAGNOSIS — R5383 Other fatigue: Secondary | ICD-10-CM | POA: Diagnosis not present

## 2020-06-23 DIAGNOSIS — E782 Mixed hyperlipidemia: Secondary | ICD-10-CM | POA: Diagnosis not present

## 2020-06-23 DIAGNOSIS — R7309 Other abnormal glucose: Secondary | ICD-10-CM | POA: Diagnosis not present

## 2020-06-23 DIAGNOSIS — Z0000 Encounter for general adult medical examination without abnormal findings: Secondary | ICD-10-CM | POA: Diagnosis not present

## 2020-06-23 DIAGNOSIS — I10 Essential (primary) hypertension: Secondary | ICD-10-CM | POA: Diagnosis not present

## 2020-06-25 DIAGNOSIS — N401 Enlarged prostate with lower urinary tract symptoms: Secondary | ICD-10-CM | POA: Diagnosis not present

## 2020-06-25 DIAGNOSIS — N138 Other obstructive and reflux uropathy: Secondary | ICD-10-CM | POA: Diagnosis not present

## 2020-06-25 DIAGNOSIS — E291 Testicular hypofunction: Secondary | ICD-10-CM | POA: Diagnosis not present

## 2020-08-04 DIAGNOSIS — I10 Essential (primary) hypertension: Secondary | ICD-10-CM | POA: Diagnosis not present

## 2020-08-04 DIAGNOSIS — E782 Mixed hyperlipidemia: Secondary | ICD-10-CM | POA: Diagnosis not present

## 2020-08-04 DIAGNOSIS — U099 Post covid-19 condition, unspecified: Secondary | ICD-10-CM | POA: Diagnosis not present

## 2020-08-04 DIAGNOSIS — R739 Hyperglycemia, unspecified: Secondary | ICD-10-CM | POA: Diagnosis not present

## 2020-08-06 DIAGNOSIS — Z23 Encounter for immunization: Secondary | ICD-10-CM | POA: Diagnosis not present

## 2020-09-24 DIAGNOSIS — R3589 Other polyuria: Secondary | ICD-10-CM | POA: Diagnosis not present

## 2020-09-24 DIAGNOSIS — E291 Testicular hypofunction: Secondary | ICD-10-CM | POA: Diagnosis not present

## 2020-09-24 DIAGNOSIS — T387X5D Adverse effect of androgens and anabolic congeners, subsequent encounter: Secondary | ICD-10-CM | POA: Diagnosis not present

## 2020-09-24 DIAGNOSIS — N401 Enlarged prostate with lower urinary tract symptoms: Secondary | ICD-10-CM | POA: Diagnosis not present

## 2020-09-24 DIAGNOSIS — N138 Other obstructive and reflux uropathy: Secondary | ICD-10-CM | POA: Diagnosis not present

## 2020-10-18 DIAGNOSIS — E7849 Other hyperlipidemia: Secondary | ICD-10-CM | POA: Diagnosis not present

## 2020-10-18 DIAGNOSIS — I10 Essential (primary) hypertension: Secondary | ICD-10-CM | POA: Diagnosis not present

## 2020-10-18 DIAGNOSIS — M1990 Unspecified osteoarthritis, unspecified site: Secondary | ICD-10-CM | POA: Diagnosis not present

## 2020-11-03 DIAGNOSIS — E782 Mixed hyperlipidemia: Secondary | ICD-10-CM | POA: Diagnosis not present

## 2020-11-03 DIAGNOSIS — E1169 Type 2 diabetes mellitus with other specified complication: Secondary | ICD-10-CM | POA: Diagnosis not present

## 2020-11-03 DIAGNOSIS — R7309 Other abnormal glucose: Secondary | ICD-10-CM | POA: Diagnosis not present

## 2020-11-03 DIAGNOSIS — R739 Hyperglycemia, unspecified: Secondary | ICD-10-CM | POA: Diagnosis not present

## 2020-11-03 DIAGNOSIS — I10 Essential (primary) hypertension: Secondary | ICD-10-CM | POA: Diagnosis not present

## 2020-12-18 DIAGNOSIS — I10 Essential (primary) hypertension: Secondary | ICD-10-CM | POA: Diagnosis not present

## 2020-12-18 DIAGNOSIS — E7849 Other hyperlipidemia: Secondary | ICD-10-CM | POA: Diagnosis not present

## 2020-12-18 DIAGNOSIS — M1990 Unspecified osteoarthritis, unspecified site: Secondary | ICD-10-CM | POA: Diagnosis not present

## 2020-12-22 DIAGNOSIS — R7309 Other abnormal glucose: Secondary | ICD-10-CM | POA: Diagnosis not present

## 2020-12-22 DIAGNOSIS — I10 Essential (primary) hypertension: Secondary | ICD-10-CM | POA: Diagnosis not present

## 2020-12-22 DIAGNOSIS — E7849 Other hyperlipidemia: Secondary | ICD-10-CM | POA: Diagnosis not present

## 2020-12-29 DIAGNOSIS — Z23 Encounter for immunization: Secondary | ICD-10-CM | POA: Diagnosis not present

## 2020-12-31 DIAGNOSIS — N401 Enlarged prostate with lower urinary tract symptoms: Secondary | ICD-10-CM | POA: Diagnosis not present

## 2020-12-31 DIAGNOSIS — N138 Other obstructive and reflux uropathy: Secondary | ICD-10-CM | POA: Diagnosis not present

## 2020-12-31 DIAGNOSIS — E291 Testicular hypofunction: Secondary | ICD-10-CM | POA: Diagnosis not present

## 2021-01-21 DIAGNOSIS — R7309 Other abnormal glucose: Secondary | ICD-10-CM | POA: Diagnosis not present

## 2021-01-21 DIAGNOSIS — I10 Essential (primary) hypertension: Secondary | ICD-10-CM | POA: Diagnosis not present

## 2021-01-21 DIAGNOSIS — E7849 Other hyperlipidemia: Secondary | ICD-10-CM | POA: Diagnosis not present

## 2021-01-21 DIAGNOSIS — E782 Mixed hyperlipidemia: Secondary | ICD-10-CM | POA: Diagnosis not present

## 2021-02-15 DIAGNOSIS — S81812A Laceration without foreign body, left lower leg, initial encounter: Secondary | ICD-10-CM | POA: Diagnosis not present

## 2021-02-17 DIAGNOSIS — I10 Essential (primary) hypertension: Secondary | ICD-10-CM | POA: Diagnosis not present

## 2021-02-17 DIAGNOSIS — E7849 Other hyperlipidemia: Secondary | ICD-10-CM | POA: Diagnosis not present

## 2021-02-17 DIAGNOSIS — M1990 Unspecified osteoarthritis, unspecified site: Secondary | ICD-10-CM | POA: Diagnosis not present

## 2021-04-01 DIAGNOSIS — S161XXA Strain of muscle, fascia and tendon at neck level, initial encounter: Secondary | ICD-10-CM | POA: Diagnosis not present

## 2021-04-01 DIAGNOSIS — S81822D Laceration with foreign body, left lower leg, subsequent encounter: Secondary | ICD-10-CM | POA: Diagnosis not present

## 2021-04-14 DIAGNOSIS — M62830 Muscle spasm of back: Secondary | ICD-10-CM | POA: Diagnosis not present

## 2021-04-14 DIAGNOSIS — M9903 Segmental and somatic dysfunction of lumbar region: Secondary | ICD-10-CM | POA: Diagnosis not present

## 2021-04-14 DIAGNOSIS — M5116 Intervertebral disc disorders with radiculopathy, lumbar region: Secondary | ICD-10-CM | POA: Diagnosis not present

## 2021-04-15 DIAGNOSIS — N138 Other obstructive and reflux uropathy: Secondary | ICD-10-CM | POA: Diagnosis not present

## 2021-04-15 DIAGNOSIS — R3589 Other polyuria: Secondary | ICD-10-CM | POA: Diagnosis not present

## 2021-04-15 DIAGNOSIS — E291 Testicular hypofunction: Secondary | ICD-10-CM | POA: Diagnosis not present

## 2021-04-15 DIAGNOSIS — N401 Enlarged prostate with lower urinary tract symptoms: Secondary | ICD-10-CM | POA: Diagnosis not present

## 2021-04-15 DIAGNOSIS — T387X5D Adverse effect of androgens and anabolic congeners, subsequent encounter: Secondary | ICD-10-CM | POA: Diagnosis not present

## 2021-05-13 DIAGNOSIS — M5116 Intervertebral disc disorders with radiculopathy, lumbar region: Secondary | ICD-10-CM | POA: Diagnosis not present

## 2021-05-13 DIAGNOSIS — M62830 Muscle spasm of back: Secondary | ICD-10-CM | POA: Diagnosis not present

## 2021-05-13 DIAGNOSIS — M9903 Segmental and somatic dysfunction of lumbar region: Secondary | ICD-10-CM | POA: Diagnosis not present

## 2021-06-07 DIAGNOSIS — I10 Essential (primary) hypertension: Secondary | ICD-10-CM | POA: Diagnosis not present

## 2021-06-07 DIAGNOSIS — E782 Mixed hyperlipidemia: Secondary | ICD-10-CM | POA: Diagnosis not present

## 2021-06-07 DIAGNOSIS — Z6824 Body mass index (BMI) 24.0-24.9, adult: Secondary | ICD-10-CM | POA: Diagnosis not present

## 2021-06-07 DIAGNOSIS — R7309 Other abnormal glucose: Secondary | ICD-10-CM | POA: Diagnosis not present

## 2021-06-17 DIAGNOSIS — M9903 Segmental and somatic dysfunction of lumbar region: Secondary | ICD-10-CM | POA: Diagnosis not present

## 2021-06-17 DIAGNOSIS — M5116 Intervertebral disc disorders with radiculopathy, lumbar region: Secondary | ICD-10-CM | POA: Diagnosis not present

## 2021-06-17 DIAGNOSIS — M62830 Muscle spasm of back: Secondary | ICD-10-CM | POA: Diagnosis not present

## 2021-07-14 DIAGNOSIS — M9903 Segmental and somatic dysfunction of lumbar region: Secondary | ICD-10-CM | POA: Diagnosis not present

## 2021-07-14 DIAGNOSIS — M62830 Muscle spasm of back: Secondary | ICD-10-CM | POA: Diagnosis not present

## 2021-07-14 DIAGNOSIS — M5116 Intervertebral disc disorders with radiculopathy, lumbar region: Secondary | ICD-10-CM | POA: Diagnosis not present

## 2021-07-19 DIAGNOSIS — Z1212 Encounter for screening for malignant neoplasm of rectum: Secondary | ICD-10-CM | POA: Diagnosis not present

## 2021-07-19 DIAGNOSIS — Z1211 Encounter for screening for malignant neoplasm of colon: Secondary | ICD-10-CM | POA: Diagnosis not present

## 2021-07-29 DIAGNOSIS — E782 Mixed hyperlipidemia: Secondary | ICD-10-CM | POA: Diagnosis not present

## 2021-07-29 DIAGNOSIS — Z0000 Encounter for general adult medical examination without abnormal findings: Secondary | ICD-10-CM | POA: Diagnosis not present

## 2021-07-29 DIAGNOSIS — M159 Polyosteoarthritis, unspecified: Secondary | ICD-10-CM | POA: Diagnosis not present

## 2021-07-29 DIAGNOSIS — L820 Inflamed seborrheic keratosis: Secondary | ICD-10-CM | POA: Diagnosis not present

## 2021-07-29 DIAGNOSIS — R634 Abnormal weight loss: Secondary | ICD-10-CM | POA: Diagnosis not present

## 2021-07-29 DIAGNOSIS — H40002 Preglaucoma, unspecified, left eye: Secondary | ICD-10-CM | POA: Diagnosis not present

## 2021-07-29 DIAGNOSIS — R739 Hyperglycemia, unspecified: Secondary | ICD-10-CM | POA: Diagnosis not present

## 2021-08-30 DIAGNOSIS — T387X5S Adverse effect of androgens and anabolic congeners, sequela: Secondary | ICD-10-CM | POA: Diagnosis not present

## 2021-08-30 DIAGNOSIS — E291 Testicular hypofunction: Secondary | ICD-10-CM | POA: Diagnosis not present

## 2021-08-30 DIAGNOSIS — R3589 Other polyuria: Secondary | ICD-10-CM | POA: Diagnosis not present

## 2021-08-30 DIAGNOSIS — N401 Enlarged prostate with lower urinary tract symptoms: Secondary | ICD-10-CM | POA: Diagnosis not present

## 2021-08-30 DIAGNOSIS — N138 Other obstructive and reflux uropathy: Secondary | ICD-10-CM | POA: Diagnosis not present

## 2021-09-02 DIAGNOSIS — L298 Other pruritus: Secondary | ICD-10-CM | POA: Diagnosis not present

## 2021-09-02 DIAGNOSIS — L538 Other specified erythematous conditions: Secondary | ICD-10-CM | POA: Diagnosis not present

## 2021-09-02 DIAGNOSIS — L820 Inflamed seborrheic keratosis: Secondary | ICD-10-CM | POA: Diagnosis not present

## 2021-09-02 DIAGNOSIS — Z789 Other specified health status: Secondary | ICD-10-CM | POA: Diagnosis not present

## 2021-09-24 DIAGNOSIS — M62838 Other muscle spasm: Secondary | ICD-10-CM | POA: Diagnosis not present

## 2021-09-24 DIAGNOSIS — H401131 Primary open-angle glaucoma, bilateral, mild stage: Secondary | ICD-10-CM | POA: Diagnosis not present

## 2021-09-24 DIAGNOSIS — H2513 Age-related nuclear cataract, bilateral: Secondary | ICD-10-CM | POA: Diagnosis not present

## 2021-09-24 DIAGNOSIS — M5412 Radiculopathy, cervical region: Secondary | ICD-10-CM | POA: Diagnosis not present

## 2021-10-01 DIAGNOSIS — M5412 Radiculopathy, cervical region: Secondary | ICD-10-CM | POA: Diagnosis not present

## 2021-10-01 DIAGNOSIS — S161XXA Strain of muscle, fascia and tendon at neck level, initial encounter: Secondary | ICD-10-CM | POA: Diagnosis not present

## 2021-10-01 DIAGNOSIS — M62838 Other muscle spasm: Secondary | ICD-10-CM | POA: Diagnosis not present

## 2021-10-20 DIAGNOSIS — I10 Essential (primary) hypertension: Secondary | ICD-10-CM | POA: Diagnosis not present

## 2021-10-20 DIAGNOSIS — E782 Mixed hyperlipidemia: Secondary | ICD-10-CM | POA: Diagnosis not present

## 2021-10-20 DIAGNOSIS — R7309 Other abnormal glucose: Secondary | ICD-10-CM | POA: Diagnosis not present

## 2021-10-20 DIAGNOSIS — M159 Polyosteoarthritis, unspecified: Secondary | ICD-10-CM | POA: Diagnosis not present

## 2021-10-20 DIAGNOSIS — M5412 Radiculopathy, cervical region: Secondary | ICD-10-CM | POA: Diagnosis not present

## 2021-10-22 DIAGNOSIS — H2513 Age-related nuclear cataract, bilateral: Secondary | ICD-10-CM | POA: Diagnosis not present

## 2021-10-22 DIAGNOSIS — H401131 Primary open-angle glaucoma, bilateral, mild stage: Secondary | ICD-10-CM | POA: Diagnosis not present

## 2021-10-29 DIAGNOSIS — I10 Essential (primary) hypertension: Secondary | ICD-10-CM | POA: Diagnosis not present

## 2021-10-29 DIAGNOSIS — Z6823 Body mass index (BMI) 23.0-23.9, adult: Secondary | ICD-10-CM | POA: Diagnosis not present

## 2021-12-02 DIAGNOSIS — N401 Enlarged prostate with lower urinary tract symptoms: Secondary | ICD-10-CM | POA: Diagnosis not present

## 2021-12-02 DIAGNOSIS — E291 Testicular hypofunction: Secondary | ICD-10-CM | POA: Diagnosis not present

## 2021-12-02 DIAGNOSIS — N138 Other obstructive and reflux uropathy: Secondary | ICD-10-CM | POA: Diagnosis not present

## 2022-01-03 DIAGNOSIS — Z23 Encounter for immunization: Secondary | ICD-10-CM | POA: Diagnosis not present

## 2022-01-22 ENCOUNTER — Ambulatory Visit
Admission: EM | Admit: 2022-01-22 | Discharge: 2022-01-22 | Disposition: A | Discharge status: Home / Self Care | Payer: Medicare Other

## 2022-01-22 ENCOUNTER — Ambulatory Visit (INDEPENDENT_AMBULATORY_CARE_PROVIDER_SITE_OTHER): Payer: Medicare Other

## 2022-01-22 DIAGNOSIS — W109XXA Fall (on) (from) unspecified stairs and steps, initial encounter: Secondary | ICD-10-CM | POA: Diagnosis not present

## 2022-01-22 DIAGNOSIS — W19XXXA Unspecified fall, initial encounter: Secondary | ICD-10-CM

## 2022-01-22 DIAGNOSIS — M25512 Pain in left shoulder: Secondary | ICD-10-CM

## 2022-01-22 DIAGNOSIS — M19012 Primary osteoarthritis, left shoulder: Secondary | ICD-10-CM | POA: Diagnosis not present

## 2022-01-22 NOTE — Discharge Instructions (Signed)
X-ray was normal.  Follow-up with orthopedist if symptoms persist or worsen.  Recommend ice application and over-the-counter pain relievers as we discussed.

## 2022-01-22 NOTE — ED Provider Notes (Signed)
[facility]    CSN: [id number] Arrival date & time: 01/22/22  1440      History   Chief Complaint Chief Complaint  Patient presents with   Shoulder Injury         HPI Theodore Wilson is a 81 y.o. male.   Patient presents with left shoulder pain after an injury that occurred around 8:30 AM this morning.  Patient reports that he is walking upstairs when he misstepped and fell landing directly on his left shoulder.  Denies any head or losing consciousness.  Denies numbness or tingling.  He has not take any medications to alleviate symptoms.  Patient reports difficulty with movement of shoulder.   Shoulder Injury    History reviewed. No pertinent past medical history.  There are no problems to display for this patient.   History reviewed. No pertinent surgical history.     Home Medications    Prior to Admission medications   Medication Sig Start Date End Date Taking? Authorizing Provider  amLODipine-benazepril (LOTREL) 10-20 MG capsule Take 1 capsule by mouth daily. 01/18/22  Yes [provider]  atorvastatin (LIPITOR) 10 MG tablet Take 10 mg by mouth once a week. 01/03/22  Yes [provider]    Family History History reviewed. No pertinent family history.  Social History Social History   Tobacco Use   Smoking status: Former    Types: Cigarettes    Quit date: 12/08/1983    Years since quitting: 38.1   Smokeless tobacco: Never     Allergies   Patient has no known allergies.   Review of Systems Review of Systems Per HPI  Physical Exam Triage Vital Signs ED Triage Vitals  Enc Vitals Group     BP 01/22/22 1511 (!) 160/82     Pulse Rate 01/22/22 1511 65     Resp 01/22/22 1511 18     Temp 01/22/22 1511 98 F (36.7 C)     Temp Source 01/22/22 1511 Oral     SpO2 01/22/22 1511 98 %     Weight --      Height --      Head Circumference --      Peak Flow --      Pain Score 01/22/22 1512 7     Pain Loc --      Pain Edu?  --      Excl. in GC? --    No data found.  Updated Vital Signs BP (!) 160/82 (BP Location: Left Arm)   Pulse 65   Temp 98 F (36.7 C) (Oral)   Resp 18   SpO2 98%   Visual Acuity Right Eye Distance:   Left Eye Distance:   Bilateral Distance:    Right Eye Near:   Left Eye Near:    Bilateral Near:     Physical Exam Constitutional:      General: He is not in acute distress.    Appearance: Normal appearance. He is not toxic-appearing or diaphoretic.  HENT:     Head: Normocephalic and atraumatic.  Eyes:     Extraocular Movements: Extraocular movements intact.     Conjunctiva/sclera: Conjunctivae normal.  Pulmonary:     Effort: Pulmonary effort is normal.  Musculoskeletal:     Comments: Patient denies tenderness to palpation throughout left shoulder.  He is having difficulty with abduction of shoulder with any movement.  No tenderness to upper arm, elbow, forearm, wrist, hand.  Grip strength is 5/5.  Neurovascular intact.  Abrasions or lacerations noted.  No obvious swelling or discoloration.  Neurological:     General: No focal deficit present.     Mental Status: He is alert and oriented to person, place, and time. Mental status is at baseline.  Psychiatric:        Mood and Affect: Mood normal.        Behavior: Behavior normal.        Thought Content: Thought content normal.        Judgment: Judgment normal.      UC Treatments / Results  Labs (all labs ordered are listed, but only abnormal results are displayed) Labs Reviewed - No data to display  EKG   Radiology No results found.  Procedures Procedures (including critical care time)  Medications Ordered in UC Medications - No data to display  Initial Impression / Assessment and Plan / UC Course  I have reviewed the triage vital signs and the nursing notes.  Pertinent labs & imaging results that were available during my care of the patient were reviewed by me and considered in my medical decision making  (see chart for details).     *** Final Clinical Impressions(s) / UC Diagnoses   Final diagnoses:  None   Discharge Instructions   None    ED Prescriptions   None    PDMP not reviewed this encounter.

## 2022-01-22 NOTE — ED Triage Notes (Signed)
Pt c/o left shoulder injury today tripping on stairs.

## 2022-01-27 ENCOUNTER — Ambulatory Visit
Admission: EM | Admit: 2022-01-27 | Discharge: 2022-01-27 | Disposition: A | Discharge status: Home / Self Care | Payer: Medicare Other

## 2022-01-27 ENCOUNTER — Other Ambulatory Visit: Payer: Self-pay

## 2022-01-27 ENCOUNTER — Encounter: Payer: Self-pay | Admitting: Emergency Medicine

## 2022-01-27 DIAGNOSIS — H1132 Conjunctival hemorrhage, left eye: Secondary | ICD-10-CM

## 2022-01-27 HISTORY — DX: Essential (primary) hypertension: I10

## 2022-01-27 NOTE — ED Triage Notes (Signed)
Pt here for redness to left eye x 2 days; denies drainage, pain or injury

## 2022-01-27 NOTE — Discharge Instructions (Signed)
You have a subconjunctival hemorrhage which is a broken blood vessel in your eye.  This will reabsorb and heal on its own.  Usually takes about 2 weeks.  Please follow-up with your eye doctor if it is persisting past 2 weeks or if you develop eye pain or blurry vision.

## 2022-01-27 NOTE — ED Provider Notes (Signed)
[facility]    CSN: [id number] Arrival date & time: 01/27/22  0802      History   Chief Complaint Chief Complaint  Patient presents with   Conjunctivitis    HPI Theodore Wilson is a 81 y.o. male.   Patient presents with redness to left eye that started about 2 days ago.  He denies trauma or foreign body to the eye.  Patient denies that he has any associated drainage, pain, itchiness.  Denies any blurry vision.  Patient does not wear contacts but does wear glasses.  Patient reports that this has occurred before in the past and it resolved on its own.  Patient also has mildly elevated blood pressure reading but reports that he has not taken his blood pressure medication yet this morning.  Patient is not reporting chest pain, shortness of breath, dizziness, blurred vision, nausea, vomiting, headache.   Conjunctivitis    Past Medical History:  Diagnosis Date   Hypertension     There are no problems to display for this patient.   History reviewed. No pertinent surgical history.     Home Medications    Prior to Admission medications   Medication Sig Start Date End Date Taking? Authorizing Provider  amLODipine-benazepril (LOTREL) 10-20 MG capsule Take 1 capsule by mouth daily. 01/18/22   [provider]  atorvastatin (LIPITOR) 10 MG tablet Take 10 mg by mouth once a week. 01/03/22   [provider]    Family History History reviewed. No pertinent family history.  Social History Social History   Tobacco Use   Smoking status: Former    Types: Cigarettes    Quit date: 12/08/1983    Years since quitting: 38.1   Smokeless tobacco: Never     Allergies   Patient has no known allergies.   Review of Systems Review of Systems Per HPI  Physical Exam Triage Vital Signs ED Triage Vitals [01/27/22 0822]  Enc Vitals Group     BP (!) 162/88     Pulse Rate 60     Resp 18     Temp (!) 97.2 F (36.2 C)     Temp Source Oral     SpO2  97 %     Weight      Height      Head Circumference      Peak Flow      Pain Score 0     Pain Loc      Pain Edu?      Excl. in GC?    No data found.  Updated Vital Signs BP (!) 162/88 (BP Location: Left Arm)   Pulse 60   Temp (!) 97.2 F (36.2 C) (Oral)   Resp 18   SpO2 97%   Visual Acuity Right Eye Distance:   Left Eye Distance:   Bilateral Distance:    Right Eye Near:   Left Eye Near:    Bilateral Near:     Physical Exam Vitals reviewed.  Constitutional:      General: He is not in acute distress.    Appearance: Normal appearance. He is not toxic-appearing or diaphoretic.  HENT:     Head: Normocephalic and atraumatic.  Eyes:     General: Lids are normal. Lids are everted, no foreign bodies appreciated. Vision grossly intact. Gaze aligned appropriately.     Extraocular Movements: Extraocular movements intact.     Conjunctiva/sclera: Conjunctivae normal.     Pupils: Pupils are equal, round, and reactive to  light.      Comments: Redness to lower eye representing subconjunctival hemorrhage.  Cardiovascular:     Rate and Rhythm: Normal rate and regular rhythm.     Pulses: Normal pulses.     Heart sounds: Normal heart sounds.  Pulmonary:     Effort: Pulmonary effort is normal. No respiratory distress.     Breath sounds: Normal breath sounds.  Neurological:     General: No focal deficit present.     Mental Status: He is alert and oriented to person, place, and time. Mental status is at baseline.     Cranial Nerves: Cranial nerves 2-12 are intact.     Sensory: Sensation is intact.     Motor: Motor function is intact.     Coordination: Coordination is intact.     Gait: Gait is intact.  Psychiatric:        Mood and Affect: Mood normal.        Behavior: Behavior normal.        Thought Content: Thought content normal.        Judgment: Judgment normal.      UC Treatments / Results  Labs (all labs ordered are listed, but only abnormal results are  displayed) Labs Reviewed - No data to display  EKG   Radiology No results found.  Procedures Procedures (including critical care time)  Medications Ordered in UC Medications - No data to display  Initial Impression / Assessment and Plan / UC Course  I have reviewed the triage vital signs and the nursing notes.  Pertinent labs & imaging results that were available during my care of the patient were reviewed by me and considered in my medical decision making (see chart for details).     Physical exam is consistent with subconjunctival hemorrhage.  No signs of infection or trauma on exam.  Educated patient that this will resolve on its own in about 2 weeks and there is no definitive treatment for it.  He does not have any associated symptoms and visual acuity appears normal so do not think that emergent evaluation to ophthalmology is necessary.  Although, patient was advised to follow-up with his established ophthalmologist if he develops any pain, discomfort, blurred vision, or if redness persists past 2 weeks.  Patient does have a mildly elevated blood pressure reading but reports that he has not taken his blood pressure medication today.  Do not think that subconjunctival hemorrhages is related to this as it is only mildly elevated.  Patient advised to restart his blood pressure medication at previously prescribed dose.  No signs of hypertensive urgency on exam.  Patient advised to monitor blood pressure at home.  Patient verbalized understanding and was agreeable with plan. Final Clinical Impressions(s) / UC Diagnoses   Final diagnoses:  Subconjunctival hemorrhage of left eye     Discharge Instructions      You have a subconjunctival hemorrhage which is a broken blood vessel in your eye.  This will reabsorb and heal on its own.  Usually takes about 2 weeks.  Please follow-up with your eye doctor if it is persisting past 2 weeks or if you develop eye pain or blurry vision.    ED  Prescriptions   None    PDMP not reviewed this encounter.   Gustavus Bryant, [place] 01/27/22 [phone]

## 2022-02-04 DIAGNOSIS — M25512 Pain in left shoulder: Secondary | ICD-10-CM | POA: Diagnosis not present

## 2022-02-04 DIAGNOSIS — S40012A Contusion of left shoulder, initial encounter: Secondary | ICD-10-CM | POA: Diagnosis not present

## 2022-02-24 DIAGNOSIS — I10 Essential (primary) hypertension: Secondary | ICD-10-CM | POA: Diagnosis not present

## 2022-02-24 DIAGNOSIS — M25512 Pain in left shoulder: Secondary | ICD-10-CM | POA: Diagnosis not present

## 2022-02-24 DIAGNOSIS — M159 Polyosteoarthritis, unspecified: Secondary | ICD-10-CM | POA: Diagnosis not present

## 2022-03-01 DIAGNOSIS — M25512 Pain in left shoulder: Secondary | ICD-10-CM | POA: Diagnosis not present

## 2022-03-03 DIAGNOSIS — R3589 Other polyuria: Secondary | ICD-10-CM | POA: Diagnosis not present

## 2022-03-03 DIAGNOSIS — N401 Enlarged prostate with lower urinary tract symptoms: Secondary | ICD-10-CM | POA: Diagnosis not present

## 2022-03-03 DIAGNOSIS — N138 Other obstructive and reflux uropathy: Secondary | ICD-10-CM | POA: Diagnosis not present

## 2022-03-03 DIAGNOSIS — E291 Testicular hypofunction: Secondary | ICD-10-CM | POA: Diagnosis not present

## 2022-03-03 DIAGNOSIS — T387X5S Adverse effect of androgens and anabolic congeners, sequela: Secondary | ICD-10-CM | POA: Diagnosis not present

## 2022-03-08 DIAGNOSIS — M25512 Pain in left shoulder: Secondary | ICD-10-CM | POA: Diagnosis not present

## 2022-03-11 DIAGNOSIS — M6281 Muscle weakness (generalized): Secondary | ICD-10-CM | POA: Diagnosis not present

## 2022-03-11 DIAGNOSIS — M25612 Stiffness of left shoulder, not elsewhere classified: Secondary | ICD-10-CM | POA: Diagnosis not present

## 2022-03-11 DIAGNOSIS — M75122 Complete rotator cuff tear or rupture of left shoulder, not specified as traumatic: Secondary | ICD-10-CM | POA: Diagnosis not present

## 2022-04-05 ENCOUNTER — Other Ambulatory Visit: Payer: Self-pay | Admitting: Orthopaedic Surgery

## 2022-04-05 DIAGNOSIS — Z01818 Encounter for other preprocedural examination: Secondary | ICD-10-CM

## 2022-04-18 ENCOUNTER — Ambulatory Visit: Payer: Medicare Other

## 2022-04-18 ENCOUNTER — Encounter: Payer: Self-pay | Admitting: Internal Medicine

## 2022-04-18 ENCOUNTER — Ambulatory Visit: Payer: Medicare Other | Admitting: Internal Medicine

## 2022-04-18 VITALS — BP 132/75 | HR 68 | Ht 67.0 in | Wt 146.0 lb

## 2022-04-18 DIAGNOSIS — E782 Mixed hyperlipidemia: Secondary | ICD-10-CM

## 2022-04-18 DIAGNOSIS — I10 Essential (primary) hypertension: Secondary | ICD-10-CM

## 2022-04-18 DIAGNOSIS — Z01818 Encounter for other preprocedural examination: Secondary | ICD-10-CM

## 2022-04-18 NOTE — Progress Notes (Signed)
Primary Physician/Referring:  Lucianne Lei, MD  Patient ID: Theodore Wilson, male    DOB: Jan 31, 1941, 82 y.o.   MRN: [id number]  Chief Complaint  Patient presents with   Pre-op Exam   New Patient (Initial Visit)   HPI:    Theodore Wilson  is a 82 y.o. male with past medical history significant for hypertension who is here to establish care with cardiology as he will be having surgery coming up next Wednesday.  It has been a few years since patient had an echocardiogram.  He states that his blood pressure has been very well-controlled for years now.  Patient is compliant with all of his medications without side effects.  He denies chest pain, palpitations, diaphoresis, syncope, edema, orthopnea, PND, claudication.  He has been having some dyspnea on exertion but this might be related to deconditioning.  Patient denies any cardiac history in himself.  He feels well and he is able to reach at least 4 METS in his daily life.  Past Medical History:  Diagnosis Date   Hypertension    Past Surgical History:  Procedure Laterality Date   KNEE ARTHROSCOPY Right    SHOULDER ARTHROSCOPY Right    Family History  Problem Relation Age of Onset   Hypertension Mother    Heart disease Mother    Hypertension Father    Multiple sclerosis Sister    Stomach cancer Sister    COPD Brother    Hypertension Brother    Diabetes Brother     Social History   Tobacco Use   Smoking status: Former    Packs/day: 0.25    Years: 20.00    Total pack years: 5.00    Types: Cigarettes    Quit date: 12/08/1983    Years since quitting: 38.3   Smokeless tobacco: Never  Substance Use Topics   Alcohol use: Not Currently   Marital Status: Married  ROS  Review of Systems  Cardiovascular:  Positive for dyspnea on exertion. Negative for chest pain, claudication, cyanosis, irregular heartbeat, leg swelling, near-syncope, orthopnea and palpitations.   Objective  Blood pressure 132/75, pulse 68, height 5\' 7"  (1.702 m),  weight 146 lb (66.2 kg), SpO2 98 %. Body mass index is 22.87 kg/m.     04/18/2022    2:18 PM 01/27/2022    8:22 AM 01/22/2022    4:04 PM  Vitals with BMI  Height 5\' 7"     Weight 146 lbs    BMI 93.81    Systolic 829 937 169  Diastolic 75 88 81  Pulse 68 60      Physical Exam Vitals reviewed.  Cardiovascular:     Rate and Rhythm: Normal rate and regular rhythm.     Pulses: Normal pulses.     Heart sounds: Murmur heard.  Pulmonary:     Effort: Pulmonary effort is normal.     Breath sounds: Normal breath sounds.  Abdominal:     General: Bowel sounds are normal.  Musculoskeletal:     Right lower leg: No edema.     Left lower leg: No edema.  Skin:    General: Skin is warm and dry.  Neurological:     Mental Status: He is alert.     Medications and allergies  No Known Allergies   Medication list after today's encounter   Current Outpatient Medications:    amLODipine-benazepril (LOTREL) 10-20 MG capsule, Take 1 capsule by mouth daily., Disp: , Rfl:    atorvastatin (LIPITOR) 10 MG  tablet, Take 10 mg by mouth once a week., Disp: , Rfl:    HYDROcodone-acetaminophen (NORCO) 7.5-325 MG tablet, Take 1 tablet by mouth every 6 (six) hours as needed., Disp: , Rfl:    latanoprost (XALATAN) 0.005 % ophthalmic solution, SMARTSIG:1 Drop(s) In Eye(s) Every Evening, Disp: , Rfl:    sildenafil (REVATIO) 20 MG tablet, SMARTSIG:2-5 Tablet(s) By Mouth Every 3 Days PRN, Disp: , Rfl:    tobramycin-dexamethasone (TOBRADEX) ophthalmic solution, Place 1 drop into the right eye 3 (three) times daily., Disp: , Rfl:    celecoxib (CELEBREX) 200 MG capsule, Take 200 mg by mouth 2 (two) times daily. (Patient not taking: Reported on 04/18/2022), Disp: , Rfl:   Laboratory examination:   Lab Results  Component Value Date   NA 140 05/21/2010   K 4.0 05/21/2010   CO2 29 05/21/2010   GLUCOSE 88 05/21/2010   BUN 12 05/21/2010   CREATININE 0.89 05/21/2010   CALCIUM 9.2 05/21/2010   GFRNONAA >60 05/21/2010        Latest Ref Rng & Units 05/21/2010    3:33 PM  CMP  Glucose 70 - 99 mg/dL 88   BUN 6 - 23 mg/dL 12   Creatinine 0.4 - 1.5 mg/dL 0.89   Sodium 135 - 145 mEq/L 140   Potassium 3.5 - 5.1 mEq/L 4.0   Chloride 96 - 112 mEq/L 105   CO2 19 - 32 mEq/L 29   Calcium 8.4 - 10.5 mg/dL 9.2       Latest Ref Rng & Units 05/25/2010   12:13 PM  CBC  Hemoglobin 13.0 - 17.0 g/dL 15.0     Lipid Panel No results for input(s): "CHOL", "TRIG", "LDLCALC", "VLDL", "HDL", "CHOLHDL", "LDLDIRECT" in the last 8760 hours.  HEMOGLOBIN A1C No results found for: "HGBA1C", "MPG" TSH No results for input(s): "TSH" in the last 8760 hours.  External labs:     Radiology:    Cardiac Studies:     EKG:   04/18/2022: Sinus Rhythm with PVCs. Left axis with LAFB. Voltage criteria for LVH met.  Assessment     ICD-10-CM   1. Pre-op evaluation  Z01.818 EKG 12-Lead    PCV ECHOCARDIOGRAM COMPLETE    2. Essential hypertension  I10 PCV ECHOCARDIOGRAM COMPLETE    3. Mixed hyperlipidemia  E78.2 PCV ECHOCARDIOGRAM COMPLETE       Orders Placed This Encounter  Procedures   EKG 12-Lead   PCV ECHOCARDIOGRAM COMPLETE    Standing Status:   Future    Number of Occurrences:   1    Standing Expiration Date:   04/19/2023    No orders of the defined types were placed in this encounter.   There are no discontinued medications.   Recommendations:   Theodore Wilson is a 82 y.o.  male who is here for pre-op evaluation  Pre-op evaluation Echocardiogram ordered given LVH and VHD on TTE done in 2021 Insurance approved and space available to obtain today after our visit Will call patient to discuss results but he is cleared for surgery as HTN/HLD are well controlled   Essential hypertension Continue current cardiac medications. Encourage low-sodium diet, less than 2000 mg daily. BP is very well controlled on current regiment Follow-up in 2 months or sooner if needed.   Mixed hyperlipidemia Continue  lipitor PCP following lipids     Floydene Flock, DO, [facility]  04/18/2022, 2:51 PM Office: [phone] Pager: [phone]

## 2022-04-22 NOTE — Progress Notes (Signed)
normal

## 2022-04-22 NOTE — Progress Notes (Signed)
Gave patient results, he acknowledged understanding and had no further questions.

## 2022-04-25 ENCOUNTER — Ambulatory Visit
Admission: RE | Admit: 2022-04-25 | Discharge: 2022-04-25 | Disposition: A | Discharge status: Home / Self Care | Payer: Medicare Other | Source: Ambulatory Visit | Attending: Orthopaedic Surgery | Admitting: Orthopaedic Surgery

## 2022-04-25 DIAGNOSIS — Z01818 Encounter for other preprocedural examination: Secondary | ICD-10-CM

## 2022-05-25 ENCOUNTER — Ambulatory Visit
Admission: EM | Admit: 2022-05-25 | Discharge: 2022-05-25 | Disposition: A | Discharge status: Home / Self Care | Payer: No Typology Code available for payment source | Attending: Emergency Medicine | Admitting: Emergency Medicine

## 2022-05-25 DIAGNOSIS — K5903 Drug induced constipation: Secondary | ICD-10-CM

## 2022-05-25 MED ORDER — DOCUSATE SODIUM 50 MG PO CAPS: 50.0000 mg | ORAL_CAPSULE | Freq: Every day | ORAL | 0 refills | Status: DC

## 2022-05-25 MED ORDER — LACTULOSE 10 GM/15ML PO SOLN: 10.0000 g | Freq: Every day | ORAL | 0 refills | Status: DC | PRN

## 2022-05-25 NOTE — Discharge Instructions (Addendum)
Today you are being treated for constipation related to your pain medications  You may take a dose of lactulose once a day for up to 3 days, you may stop usage if you have a bowel movement before 3 days  Take Softeners daily so that when you have the urge to naturally go to the bathroom stool will not be hard , take every day until you are no longer taking pain medications  If you have not had a bowel movement in 3 days begin use of MiraLAX once a day until bowel movement occurs then may return to using as needed  May follow-up with his urgent care or your primary doctor if symptoms continue to persist  At any point if you begin to have severe abdominal pain please go to the nearest emergency department for immediate evaluation

## 2022-05-25 NOTE — ED Provider Notes (Signed)
[facility]    CSN: [id number] Arrival date & time: 05/25/22  1314      History   Chief Complaint Chief Complaint  Patient presents with   Constipation    HPI Theodore Wilson is a 82 y.o. male.   Patient presents for evaluation of constipation beginning 1 day ago.  Has had hard stools does not feel as if he is completely emptying when defecating.  Has had 2 small partial bowel movements this morning, last complete bowel movement for 6 days ago.  Has attempted use of MiraLAX and a Fleet suppository this morning with no relief.  Has not been taking stool softeners daily.  Currently taking narcotic pain medication due to recent shoulder surgery.  Denies abdominal pain.  Has been able to tolerate food and liquids.  Past Medical History:  Diagnosis Date   Hypertension     There are no problems to display for this patient.   Past Surgical History:  Procedure Laterality Date   KNEE ARTHROSCOPY Right    SHOULDER ARTHROSCOPY Right        Home Medications    Prior to Admission medications   Medication Sig Start Date End Date Taking? Authorizing Provider  amLODipine-benazepril (LOTREL) 10-20 MG capsule Take 1 capsule by mouth daily. 01/18/22   [provider]  atorvastatin (LIPITOR) 10 MG tablet Take 10 mg by mouth once a week. 01/03/22   [provider]  celecoxib (CELEBREX) 200 MG capsule Take 200 mg by mouth 2 (two) times daily. Patient not taking: Reported on 04/18/2022 03/01/22   [provider]  HYDROcodone-acetaminophen (NORCO) 7.5-325 MG tablet Take 1 tablet by mouth every 6 (six) hours as needed. 02/04/22   [provider]  latanoprost (XALATAN) 0.005 % ophthalmic solution SMARTSIG:1 Drop(s) In Eye(s) Every Evening 12/20/21   [provider]  sildenafil (REVATIO) 20 MG tablet SMARTSIG:2-5 Tablet(s) By Mouth Every 3 Days PRN 03/17/22   [provider]  tobramycin-dexamethasone (TOBRADEX) ophthalmic solution  Place 1 drop into the right eye 3 (three) times daily. 04/05/22   [provider]    Family History Family History  Problem Relation Age of Onset   Hypertension Mother    Heart disease Mother    Hypertension Father    Multiple sclerosis Sister    Stomach cancer Sister    COPD Brother    Hypertension Brother    Diabetes Brother     Social History Social History   Tobacco Use   Smoking status: Former    Packs/day: 0.25    Years: 20.00    Total pack years: 5.00    Types: Cigarettes    Quit date: 12/08/1983    Years since quitting: 38.4   Smokeless tobacco: Never  Vaping Use   Vaping Use: Never used  Substance Use Topics   Alcohol use: Not Currently     Allergies   Patient has no known allergies.   Review of Systems Review of Systems  Constitutional: Negative.   HENT: Negative.    Respiratory: Negative.    Cardiovascular: Negative.   Gastrointestinal:  Positive for constipation. Negative for abdominal distention, abdominal pain, anal bleeding, blood in stool, diarrhea, nausea, rectal pain and vomiting.     Physical Exam Triage Vital Signs ED Triage Vitals  Enc Vitals Group     BP 05/25/22 1327 (!) 143/87     Pulse Rate 05/25/22 1325 93     Resp 05/25/22 1325 17     Temp 05/25/22 1325  98.7 F (37.1 C)     Temp Source 05/25/22 1325 Oral     SpO2 05/25/22 1325 98 %     Weight --      Height --      Head Circumference --      Peak Flow --      Pain Score 05/25/22 1324 4     Pain Loc --      Pain Edu? --      Excl. in [place]? --    No data found.  Updated Vital Signs BP (!) 143/87   Pulse 93   Temp 98.7 F (37.1 C) (Oral)   Resp 17   SpO2 98%   Visual Acuity Right Eye Distance:   Left Eye Distance:   Bilateral Distance:    Right Eye Near:   Left Eye Near:    Bilateral Near:     Physical Exam Constitutional:      Appearance: Normal appearance.  Eyes:     Extraocular Movements: Extraocular movements intact.  Pulmonary:     Effort:  Pulmonary effort is normal.  Abdominal:     General: Abdomen is flat. Bowel sounds are normal. There is no distension.     Palpations: Abdomen is soft.     Tenderness: There is no abdominal tenderness. There is no guarding.  Neurological:     Mental Status: He is alert and oriented to person, place, and time. Mental status is at baseline.      UC Treatments / Results  Labs (all labs ordered are listed, but only abnormal results are displayed) Labs Reviewed - No data to display  EKG   Radiology No results found.  Procedures Procedures (including critical care time)  Medications Ordered in UC Medications - No data to display  Initial Impression / Assessment and Plan / UC Course  I have reviewed the triage vital signs and the nursing notes.  Pertinent labs & imaging results that were available during my care of the patient were reviewed by me and considered in my medical decision making (see chart for details).  Constipation due to pain medication  Most likely medication induced, discussed with patient and spouse, no tenderness to the abdomen he has been able to tolerate food and liquids, low suspicion for obstruction, prescribe lactulose to be taken daily up to 3 days to help alleviate symptoms, prescribed stool softener to be taken daily, no bowel movement has occurred in 3 days he is to begin MiraLAX daily until bowel movement occurs then may use as needed, advised increase fluid intake as well as good fiber intake, given strict precautions for signs of instruction to go to the nearest emergency department for imaging and evaluation, verbalized understanding Final Clinical Impressions(s) / UC Diagnoses   Final diagnoses:  Constipation due to pain medication     Discharge Instructions      Today you are being treated for constipation related to your pain medications  You may take a dose of lactulose once a day for up to 3 days, you may stop usage if you have a bowel  movement prior to thin  Softeners daily so that when you have the urge to naturally go to the bathroom stool will not be hard and  If you have not had a bowel movement in 3 days begin use of MiraLAX until bowel movement occurs then may return to using as needed  May follow-up with his urgent care or your primary doctor if symptoms continue to persist  At any point if you begin to have severe abdominal pain please go to the nearest emergency department for immediate evaluation   ED Prescriptions   None    PDMP not reviewed this encounter.   Hans Eden, NP 05/25/22 1349

## 2022-05-25 NOTE — ED Triage Notes (Signed)
Pt presents with constipation since yesterday; pt just recently had a shoulder surgery and is on pain medications.

## 2022-06-16 ENCOUNTER — Encounter: Payer: Self-pay | Admitting: Internal Medicine

## 2022-06-16 ENCOUNTER — Ambulatory Visit: Payer: Medicare Other | Admitting: Internal Medicine

## 2022-06-16 VITALS — BP 153/82 | HR 52 | Resp 16 | Ht 67.0 in | Wt 146.0 lb

## 2022-06-16 DIAGNOSIS — I10 Essential (primary) hypertension: Secondary | ICD-10-CM

## 2022-06-16 DIAGNOSIS — E782 Mixed hyperlipidemia: Secondary | ICD-10-CM

## 2022-06-16 NOTE — Progress Notes (Signed)
Primary Physician/Referring:  Lucianne Lei, MD  Patient ID: Theodore Wilson, male    DOB: 30-Jul-1940, 82 y.o.   MRN: [id number]  Chief Complaint  Patient presents with   Pre-op Exam   Follow-up   HPI:    Theodore Wilson  is a 82 y.o. male with past medical history significant for hypertension who is here for a follow-up visit. He has been doing well since the last time I saw him. He had his surgery but is still having a lot of pain which is why his pressure is elevated today. The recovery period is a few months so he is hopeful he will start feeling better. Patient is tolerating his medications without issues. No concerns or complaints today.  He denies chest pain, palpitations, diaphoresis, syncope, edema, orthopnea, PND, claudication.    Past Medical History:  Diagnosis Date   Hypertension    Past Surgical History:  Procedure Laterality Date   KNEE ARTHROSCOPY Right    SHOULDER ARTHROSCOPY Right    Family History  Problem Relation Age of Onset   Hypertension Mother    Heart disease Mother    Hypertension Father    Multiple sclerosis Sister    Stomach cancer Sister    COPD Brother    Hypertension Brother    Diabetes Brother     Social History   Tobacco Use   Smoking status: Former    Packs/day: 0.25    Years: 20.00    Additional pack years: 0.00    Total pack years: 5.00    Types: Cigarettes    Quit date: 12/08/1983    Years since quitting: 38.5   Smokeless tobacco: Never  Substance Use Topics   Alcohol use: Not Currently   Marital Status: Married  ROS  Review of Systems  Cardiovascular:  Negative for chest pain, claudication, cyanosis, dyspnea on exertion, irregular heartbeat, leg swelling, near-syncope, orthopnea and palpitations.  Musculoskeletal:  Positive for joint pain.   Objective  Blood pressure (!) 153/82, pulse (!) 52, resp. rate 16, height 5\' 7"  (1.702 m), weight 146 lb (66.2 kg), SpO2 98 %. Body mass index is 22.87 kg/m.     06/16/2022   11:09 AM  05/25/2022    1:27 PM 05/25/2022    1:25 PM  Vitals with BMI  Height 5\' 7"     Weight 146 lbs    BMI [id number]    Systolic 0000000 [id number]   Diastolic 82 87   Pulse 52  93     Physical Exam Vitals reviewed.  Cardiovascular:     Rate and Rhythm: Normal rate and regular rhythm.     Pulses: Normal pulses.     Heart sounds: Murmur heard.  Pulmonary:     Effort: Pulmonary effort is normal.     Breath sounds: Normal breath sounds.  Abdominal:     General: Bowel sounds are normal.  Musculoskeletal:     Right lower leg: No edema.     Left lower leg: No edema.  Skin:    General: Skin is warm and dry.  Neurological:     Mental Status: He is alert.     Medications and allergies  No Known Allergies   Medication list after today's encounter   Current Outpatient Medications:    amLODipine-benazepril (LOTREL) 10-20 MG capsule, Take 1 capsule by mouth daily., Disp: , Rfl:    atorvastatin (LIPITOR) 10 MG tablet, Take 10 mg by mouth once a week., Disp: , Rfl:    celecoxib (  CELEBREX) 200 MG capsule, Take 200 mg by mouth 2 (two) times daily., Disp: , Rfl:    latanoprost (XALATAN) 0.005 % ophthalmic solution, SMARTSIG:1 Drop(s) In Eye(s) Every Evening, Disp: , Rfl:    tobramycin-dexamethasone (TOBRADEX) ophthalmic solution, Place 1 drop into the right eye 3 (three) times daily., Disp: , Rfl:    sildenafil (REVATIO) 20 MG tablet, SMARTSIG:2-5 Tablet(s) By Mouth Every 3 Days PRN, Disp: , Rfl:   Laboratory examination:   Lab Results  Component Value Date   NA 140 05/21/2010   K 4.0 05/21/2010   CO2 29 05/21/2010   GLUCOSE 88 05/21/2010   BUN 12 05/21/2010   CREATININE 0.89 05/21/2010   CALCIUM 9.2 05/21/2010   GFRNONAA >60 05/21/2010       Latest Ref Rng & Units 05/21/2010    3:33 PM  CMP  Glucose 70 - 99 mg/dL 88   BUN 6 - 23 mg/dL 12   Creatinine 0.4 - 1.5 mg/dL 0.89   Sodium 135 - 145 mEq/L 140   Potassium 3.5 - 5.1 mEq/L 4.0   Chloride 96 - 112 mEq/L 105   CO2 19 - 32 mEq/L 29    Calcium 8.4 - 10.5 mg/dL 9.2       Latest Ref Rng & Units 05/25/2010   12:13 PM  CBC  Hemoglobin 13.0 - 17.0 g/dL 15.0     Lipid Panel No results for input(s): "CHOL", "TRIG", "LDLCALC", "VLDL", "HDL", "CHOLHDL", "LDLDIRECT" in the last 8760 hours.  HEMOGLOBIN A1C No results found for: "HGBA1C", "MPG" TSH No results for input(s): "TSH" in the last 8760 hours.  External labs:     Radiology:    Cardiac Studies:   Echocardiogram 04/18/2022:  Left ventricle cavity is normal in size. Mild concentric hypertrophy of  the left ventricle. Normal global wall motion. Normal LV systolic function  with EF 61%. Doppler evidence of grade I (impaired) diastolic dysfunction,  normal LAP.  Mild tricuspid regurgitation. Estimated pulmonary artery systolic pressure  32 mmHg.   EKG:   04/18/2022: Sinus Rhythm with PVCs. Left axis with LAFB. Voltage criteria for LVH met.  Assessment     ICD-10-CM   1. Essential hypertension  I10     2. Mixed hyperlipidemia  E78.2        No orders of the defined types were placed in this encounter.   No orders of the defined types were placed in this encounter.   Medications Discontinued During This Encounter  Medication Reason   docusate sodium (COLACE) 50 MG capsule    HYDROcodone-acetaminophen (NORCO) 7.5-325 MG tablet    lactulose (CHRONULAC) 10 GM/15ML solution      Recommendations:   Theodore Wilson is a 82 y.o.  male with HTN and HLD   Essential hypertension Continue current cardiac medications. Encourage low-sodium diet, less than 2000 mg daily. BP is very well controlled on current regiment but elevated today due to pain Follow-up in 6-12 months or sooner if needed.   Mixed hyperlipidemia Continue lipitor PCP following lipids     Floydene Flock, DO, [facility]  06/16/2022, 2:00 PM Office: [phone] Pager: [phone]

## 2022-09-04 ENCOUNTER — Ambulatory Visit
Admission: EM | Admit: 2022-09-04 | Discharge: 2022-09-04 | Disposition: A | Discharge status: Home / Self Care | Payer: Medicare Other | Attending: Internal Medicine | Admitting: Internal Medicine

## 2022-09-04 ENCOUNTER — Encounter: Payer: Self-pay | Admitting: Emergency Medicine

## 2022-09-04 DIAGNOSIS — R051 Acute cough: Secondary | ICD-10-CM

## 2022-09-04 DIAGNOSIS — B349 Viral infection, unspecified: Secondary | ICD-10-CM

## 2022-09-04 MED ORDER — BENZONATATE 100 MG PO CAPS: 100.0000 mg | ORAL_CAPSULE | Freq: Three times a day (TID) | ORAL | 0 refills | Status: DC | PRN

## 2022-09-04 NOTE — ED Provider Notes (Signed)
[facility]    CSN: [id number] Arrival date & time: 09/04/22  6213      History   Chief Complaint Chief Complaint  Patient presents with   Cough    HPI Theodore Wilson is a 82 y.o. male.   Patient presents with persistent cough that has been present for about 5 days.  Reports that symptoms started with a sore throat, runny nose, watery eyes but those symptoms have resolved and he is left with a dry cough.  He has not had any fever or known sick contacts.  Denies chest pain or shortness of breath.  Denies history of asthma or COPD and patient does not smoke cigarettes.  He reports that he has used an all-natural cough syrup over-the-counter with minimal improvement in coughing.   Cough   Past Medical History:  Diagnosis Date   Hypertension     There are no problems to display for this patient.   Past Surgical History:  Procedure Laterality Date   KNEE ARTHROSCOPY Right    SHOULDER ARTHROSCOPY Right        Home Medications    Prior to Admission medications   Medication Sig Start Date End Date Taking? Authorizing Provider  amLODipine-benazepril (LOTREL) 10-20 MG capsule Take 1 capsule by mouth daily. 01/18/22  Yes [provider]  atorvastatin (LIPITOR) 10 MG tablet Take 10 mg by mouth once a week. 01/03/22  Yes [provider]  benzonatate (TESSALON) 100 MG capsule Take 1 capsule (100 mg total) by mouth every 8 (eight) hours as needed for cough. 09/04/22  Yes Enriqueta Augusta, Rolly Salter E, FNP  celecoxib (CELEBREX) 200 MG capsule Take 200 mg by mouth 2 (two) times daily. 03/01/22   [provider]  latanoprost (XALATAN) 0.005 % ophthalmic solution SMARTSIG:1 Drop(s) In Eye(s) Every Evening 12/20/21   [provider]  sildenafil (REVATIO) 20 MG tablet SMARTSIG:2-5 Tablet(s) By Mouth Every 3 Days PRN 03/17/22   [provider]  tobramycin-dexamethasone (TOBRADEX) ophthalmic solution Place 1 drop into the right eye 3 (three) times  daily. 04/05/22   [provider]    Family History Family History  Problem Relation Age of Onset   Hypertension Mother    Heart disease Mother    Hypertension Father    Multiple sclerosis Sister    Stomach cancer Sister    COPD Brother    Hypertension Brother    Diabetes Brother     Social History Social History   Tobacco Use   Smoking status: Former    Packs/day: 0.25    Years: 20.00    Additional pack years: 0.00    Total pack years: 5.00    Types: Cigarettes    Quit date: 12/08/1983    Years since quitting: 38.7   Smokeless tobacco: Never  Vaping Use   Vaping Use: Never used  Substance Use Topics   Alcohol use: Not Currently     Allergies   Patient has no known allergies.   Review of Systems Review of Systems Per HPI  Physical Exam Triage Vital Signs ED Triage Vitals  Enc Vitals Group     BP 09/04/22 0915 (!) 142/85     Pulse Rate 09/04/22 0915 61     Resp 09/04/22 0915 16     Temp 09/04/22 0915 (!) 97.5 F (36.4 C)     Temp Source 09/04/22 0915 Oral     SpO2 09/04/22 0915 95 %     Weight --  Height --      Head Circumference --      Peak Flow --      Pain Score 09/04/22 0913 2     Pain Loc --      Pain Edu? --      Excl. in GC? --    No data found.  Updated Vital Signs BP (!) 142/85 (BP Location: Left Arm)   Pulse 61   Temp (!) 97.5 F (36.4 C) (Oral)   Resp 16   SpO2 95%   Visual Acuity Right Eye Distance:   Left Eye Distance:   Bilateral Distance:    Right Eye Near:   Left Eye Near:    Bilateral Near:     Physical Exam Constitutional:      General: He is not in acute distress.    Appearance: Normal appearance. He is not toxic-appearing or diaphoretic.  HENT:     Head: Normocephalic and atraumatic.     Right Ear: Tympanic membrane and ear canal normal.     Left Ear: Tympanic membrane and ear canal normal.     Nose: No congestion.     Mouth/Throat:     Mouth: Mucous membranes are moist.     Pharynx: No  posterior oropharyngeal erythema.  Eyes:     Extraocular Movements: Extraocular movements intact.     Conjunctiva/sclera: Conjunctivae normal.     Pupils: Pupils are equal, round, and reactive to light.  Cardiovascular:     Rate and Rhythm: Normal rate and regular rhythm.     Pulses: Normal pulses.     Heart sounds: Normal heart sounds.  Pulmonary:     Effort: Pulmonary effort is normal. No respiratory distress.     Breath sounds: Normal breath sounds. No stridor. No wheezing, rhonchi or rales.  Abdominal:     General: Abdomen is flat. Bowel sounds are normal.     Palpations: Abdomen is soft.  Musculoskeletal:        General: Normal range of motion.     Cervical back: Normal range of motion.  Skin:    General: Skin is warm and dry.  Neurological:     General: No focal deficit present.     Mental Status: He is alert and oriented to person, place, and time. Mental status is at baseline.  Psychiatric:        Mood and Affect: Mood normal.        Behavior: Behavior normal.      UC Treatments / Results  Labs (all labs ordered are listed, but only abnormal results are displayed) Labs Reviewed - No data to display  EKG   Radiology No results found.  Procedures Procedures (including critical care time)  Medications Ordered in UC Medications - No data to display  Initial Impression / Assessment and Plan / UC Course  I have reviewed the triage vital signs and the nursing notes.  Pertinent labs & imaging results that were available during my care of the patient were reviewed by me and considered in my medical decision making (see chart for details).     Suspect possible postviral cough as other symptoms have resolved.  There are no adventitious lung sounds on exam so do not think that chest imaging is necessary.  Patient requesting cough medication so benzonatate was prescribed for patient to take as needed.  Patient offered COVID testing but he declined this.  Advised to  follow-up if any symptoms persist or worsen.  Patient verbalized understanding and  was agreeable with plan. Final Clinical Impressions(s) / UC Diagnoses   Final diagnoses:  Acute cough  Viral illness     Discharge Instructions      I have prescribed you a cough medication to take as needed.  Most likely something viral in nature which should run its course.  If any symptoms persist or worsen, please follow-up.    ED Prescriptions     Medication Sig Dispense Auth. Provider   benzonatate (TESSALON) 100 MG capsule Take 1 capsule (100 mg total) by mouth every 8 (eight) hours as needed for cough. 21 capsule [place], Acie Fredrickson, [place]      PDMP not reviewed this encounter.   Gustavus Bryant, [place] 09/04/22 1006

## 2022-09-04 NOTE — ED Triage Notes (Signed)
Patient presents to Urgent Care with complaints of cough, head congestion, chest congestion since 5 days ago. Patient reports taking Tylenol and cough syrup. Symptoms keeping up at night.

## 2022-09-04 NOTE — Discharge Instructions (Signed)
I have prescribed you a cough medication to take as needed.  Most likely something viral in nature which should run its course.  If any symptoms persist or worsen, please follow-up.

## 2022-11-16 ENCOUNTER — Ambulatory Visit
Admission: EM | Admit: 2022-11-16 | Discharge: 2022-11-16 | Disposition: A | Discharge status: Home / Self Care | Payer: Medicare Other | Attending: Internal Medicine | Admitting: Internal Medicine

## 2022-11-16 DIAGNOSIS — S50812A Abrasion of left forearm, initial encounter: Secondary | ICD-10-CM

## 2022-11-16 DIAGNOSIS — Z23 Encounter for immunization: Secondary | ICD-10-CM | POA: Diagnosis not present

## 2022-11-16 DIAGNOSIS — W19XXXA Unspecified fall, initial encounter: Secondary | ICD-10-CM | POA: Diagnosis not present

## 2022-11-16 MED ORDER — TETANUS-DIPHTH-ACELL PERTUSSIS 5-2.5-18.5 LF-MCG/0.5 IM SUSY
0.5000 mL | PREFILLED_SYRINGE | Freq: Once | INTRAMUSCULAR | Status: AC
  Administered 2022-11-16: 0.5 mL via INTRAMUSCULAR

## 2022-11-16 NOTE — ED Triage Notes (Signed)
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

## 2022-11-16 NOTE — Discharge Instructions (Addendum)
Please keep wound covered until healed over to prevent infection.  Monitor for signs of infection that include increased redness, swelling, pus and follow-up if this occurs.  Tetanus vaccine updated today.

## 2022-11-16 NOTE — ED Provider Notes (Signed)
[facility]    CSN: [id number] Arrival date & time: 11/16/22  1453      History   Chief Complaint Chief Complaint  Patient presents with   Fall    HPI Theodore Wilson is a 82 y.o. male.   Patient presents with abrasion to left forearm that occurred directly prior to arrival to urgent care after a fall today.  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  Denies hitting head or losing consciousness.  He does not take any blood thinning medications.  Patient is not sure of last tetanus vaccination.   Fall    Past Medical History:  Diagnosis Date   Hypertension     There are no problems to display for this patient.   Past Surgical History:  Procedure Laterality Date   KNEE ARTHROSCOPY Right    SHOULDER ARTHROSCOPY Right        Home Medications    Prior to Admission medications   Medication Sig Start Date End Date Taking? Authorizing Provider  amLODipine-benazepril (LOTREL) 10-20 MG capsule Take 1 capsule by mouth daily. 01/18/22   [provider]  atorvastatin (LIPITOR) 10 MG tablet Take 10 mg by mouth once a week. 01/03/22   [provider]  benzonatate (TESSALON) 100 MG capsule Take 1 capsule (100 mg total) by mouth every 8 (eight) hours as needed for cough. 09/04/22   Gustavus Bryant, FNP  celecoxib (CELEBREX) 200 MG capsule Take 200 mg by mouth 2 (two) times daily. 03/01/22   [provider]  latanoprost (XALATAN) 0.005 % ophthalmic solution SMARTSIG:1 Drop(s) In Eye(s) Every Evening 12/20/21   [provider]  sildenafil (REVATIO) 20 MG tablet SMARTSIG:2-5 Tablet(s) By Mouth Every 3 Days PRN 03/17/22   [provider]  tobramycin-dexamethasone (TOBRADEX) ophthalmic solution Place 1 drop into the right eye 3 (three) times daily. 04/05/22   [provider]    Family History Family History  Problem Relation Age of Onset   Hypertension Mother    Heart disease Mother     Hypertension Father    Multiple sclerosis Sister    Stomach cancer Sister    COPD Brother    Hypertension Brother    Diabetes Brother     Social History Social History   Tobacco Use   Smoking status: Former    Current packs/day: 0.00    Average packs/day: 0.3 packs/day for 20.0 years (5.0 ttl pk-yrs)    Types: Cigarettes    Start date: 12/08/1963    Quit date: 12/08/1983    Years since quitting: 38.9   Smokeless tobacco: Never  Vaping Use   Vaping status: Never Used  Substance Use Topics   Alcohol use: Not Currently     Allergies   Patient has no known allergies.   Review of Systems Review of Systems Per HPI  Physical Exam Triage Vital Signs ED Triage Vitals [11/16/22 1506]  Encounter Vitals Group     BP (!) 163/84     Systolic BP Percentile      Diastolic BP Percentile      Pulse Rate 67     Resp 17     Temp 98.7 F (37.1 C)     Temp Source Oral     SpO2 95 %     Weight 149 lb (67.6 kg)     Height      Head Circumference      Peak Flow  Pain Score 2     Pain Loc      Pain Education      Exclude from Growth Chart    No data found.  Updated Vital Signs BP (!) 163/84 (BP Location: Left Arm)   Pulse 67   Temp 98.7 F (37.1 C) (Oral)   Resp 17   Wt 149 lb (67.6 kg)   SpO2 95%   BMI 23.34 kg/m   Visual Acuity Right Eye Distance:   Left Eye Distance:   Bilateral Distance:    Right Eye Near:   Left Eye Near:    Bilateral Near:     Physical Exam Constitutional:      General: He is not in acute distress.    Appearance: Normal appearance. He is not toxic-appearing or diaphoretic.  HENT:     Head: Normocephalic and atraumatic.  Eyes:     Extraocular Movements: Extraocular movements intact.     Conjunctiva/sclera: Conjunctivae normal.  Pulmonary:     Effort: Pulmonary effort is normal.  Musculoskeletal:     Comments: No tenderness to palpation to left arm.  Full range of motion of arm present.  Grip strength 5/5.  Neurovascularly  intact.  Skin:    Comments: Very minimal superficial abrasion with no bleeding present to lateral proximal left forearm.  Neurological:     General: No focal deficit present.     Mental Status: He is alert and oriented to person, place, and time. Mental status is at baseline.  Psychiatric:        Mood and Affect: Mood normal.        Behavior: Behavior normal.        Thought Content: Thought content normal.        Judgment: Judgment normal.      UC Treatments / Results  Labs (all labs ordered are listed, but only abnormal results are displayed) Labs Reviewed - No data to display  EKG   Radiology No results found.  Procedures Procedures (including critical care time)  Medications Ordered in UC Medications  Tdap (BOOSTRIX) injection 0.5 mL (0.5 mLs Intramuscular Given 11/16/22 1522)    Initial Impression / Assessment and Plan / UC Course  I have reviewed the triage vital signs and the nursing notes.  Pertinent labs & imaging results that were available during my care of the patient were reviewed by me and considered in my medical decision making (see chart for details).     Patient has very superficial abrasion present to left forearm.  There is no direct bony tenderness and patient has full range of motion, so do not think that imaging is necessary. Imaging deferred with shared decision making with patient.  Dressing applied and wound cleansed by clinical staff.  Tetanus vaccine updated.  Advised to follow-up if symptoms persist or worsen.  Advised daily dressing changes and as needed.  Patient verbalized understanding and was agreeable with plan. Final Clinical Impressions(s) / UC Diagnoses   Final diagnoses:  Fall, initial encounter  Abrasion of left forearm, initial encounter     Discharge Instructions      Please keep wound covered until healed over to prevent infection.  Monitor for signs of infection that include increased redness, swelling, pus and follow-up  if this occurs.  Tetanus vaccine updated today.    ED Prescriptions   None    I have reviewed the PDMP during this encounter.   Gustavus Bryant, [place] 11/16/22 [phone]

## 2022-11-19 ENCOUNTER — Ambulatory Visit
Admission: EM | Admit: 2022-11-19 | Discharge: 2022-11-19 | Disposition: A | Discharge status: Home / Self Care | Payer: Medicare Other | Attending: Physician Assistant | Admitting: Physician Assistant

## 2022-11-19 DIAGNOSIS — S46812A Strain of other muscles, fascia and tendons at shoulder and upper arm level, left arm, initial encounter: Secondary | ICD-10-CM | POA: Diagnosis not present

## 2022-11-19 MED ORDER — TIZANIDINE HCL 4 MG PO TABS: 2.0000 mg | ORAL_TABLET | Freq: Four times a day (QID) | ORAL | 0 refills | Status: DC | PRN

## 2022-11-19 NOTE — ED Provider Notes (Signed)
[facility]    CSN: [id number] Arrival date & time: 11/19/22  1003      History   Chief Complaint Chief Complaint  Patient presents with   Neck Pain    "Spasm"     HPI Theodore Wilson is a 82 y.o. male.   Patient here today for evaluation of left-sided trapezius pain that started several days ago.  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  He does go to PT and states this has been somewhat helpful to the right side however he then developed pain to the left.  He has not had any injury.  He denies any numbness or tingling.  He has been taking Tylenol with mild relief.  The history is provided by the patient.  Neck Pain Associated symptoms: no fever and no numbness     Past Medical History:  Diagnosis Date   Hypertension     There are no problems to display for this patient.   Past Surgical History:  Procedure Laterality Date   KNEE ARTHROSCOPY Right    SHOULDER ARTHROSCOPY Right        Home Medications    Prior to Admission medications   Medication Sig Start Date End Date Taking? Authorizing Provider  amLODipine-benazepril (LOTREL) 10-20 MG capsule Take 1 capsule by mouth daily. 01/18/22  Yes [provider]  tiZANidine (ZANAFLEX) 4 MG tablet Take 0.5-1 tablets (2-4 mg total) by mouth every 6 (six) hours as needed for muscle spasms. 11/19/22  Yes Tomi Bamberger, PA-C  atorvastatin (LIPITOR) 10 MG tablet Take 10 mg by mouth once a week. 01/03/22   [provider]  benzonatate (TESSALON) 100 MG capsule Take 1 capsule (100 mg total) by mouth every 8 (eight) hours as needed for cough. 09/04/22   Gustavus Bryant, FNP  celecoxib (CELEBREX) 200 MG capsule Take 200 mg by mouth 2 (two) times daily. 03/01/22   [provider]  latanoprost (XALATAN) 0.005 % ophthalmic solution SMARTSIG:1 Drop(s) In Eye(s) Every Evening 12/20/21   [provider]  sildenafil (REVATIO) 20 MG tablet  SMARTSIG:2-5 Tablet(s) By Mouth Every 3 Days PRN 03/17/22   [provider]  tobramycin-dexamethasone (TOBRADEX) ophthalmic solution Place 1 drop into the right eye 3 (three) times daily. 04/05/22   [provider]    Family History Family History  Problem Relation Age of Onset   Hypertension Mother    Heart disease Mother    Hypertension Father    Multiple sclerosis Sister    Stomach cancer Sister    COPD Brother    Hypertension Brother    Diabetes Brother     Social History Social History   Tobacco Use   Smoking status: Former    Current packs/day: 0.00    Average packs/day: 0.3 packs/day for 20.0 years (5.0 ttl pk-yrs)    Types: Cigarettes    Start date: 12/08/1963    Quit date: 12/08/1983    Years since quitting: 38.9   Smokeless tobacco: Never  Vaping Use   Vaping status: Never Used  Substance Use Topics   Alcohol use: Not Currently   Drug use: Never     Allergies   Patient has no known allergies.   Review of Systems Review of Systems  Constitutional:  Negative for chills and fever.  Eyes:  Negative for discharge and redness.  Respiratory:  Negative for shortness of breath.   Musculoskeletal:  Positive for myalgias. Negative for  neck pain.  Neurological:  Negative for numbness.     Physical Exam Triage Vital Signs ED Triage Vitals  Encounter Vitals Group     BP 11/19/22 1032 (!) 151/78     Systolic BP Percentile --      Diastolic BP Percentile --      Pulse Rate 11/19/22 1032 64     Resp 11/19/22 1032 18     Temp 11/19/22 1032 98.7 F (37.1 C)     Temp Source 11/19/22 1032 Oral     SpO2 11/19/22 1032 97 %     Weight 11/19/22 1030 149 lb 0.5 oz (67.6 kg)     Height 11/19/22 1030 5\' 7"  (1.702 m)     Head Circumference --      Peak Flow --      Pain Score 11/19/22 1028 10     Pain Loc --      Pain Education --      Exclude from Growth Chart --    No data found.  Updated Vital Signs BP (!) 148/70 (BP Location: Right Arm)    Pulse 64   Temp 98.7 F (37.1 C) (Oral)   Resp 18   Ht 5\' 7"  (1.702 m)   Wt 149 lb 0.5 oz (67.6 kg)   SpO2 97%   BMI 23.34 kg/m      Physical Exam Vitals and nursing note reviewed.  Constitutional:      General: He is not in acute distress.    Appearance: Normal appearance. He is not ill-appearing.  HENT:     Head: Normocephalic and atraumatic.  Eyes:     Conjunctiva/sclera: Conjunctivae normal.  Cardiovascular:     Rate and Rhythm: Normal rate.  Pulmonary:     Effort: Pulmonary effort is normal. No respiratory distress.  Musculoskeletal:     Comments: No TTP to cervical spine, mild TTP to left trapezius with muscle spasm noted  Neurological:     Mental Status: He is alert.  Psychiatric:        Mood and Affect: Mood normal.        Behavior: Behavior normal.        Thought Content: Thought content normal.      UC Treatments / Results  Labs (all labs ordered are listed, but only abnormal results are displayed) Labs Reviewed - No data to display  EKG   Radiology No results found.  Procedures Procedures (including critical care time)  Medications Ordered in UC Medications - No data to display  Initial Impression / Assessment and Plan / UC Course  I have reviewed the triage vital signs and the nursing notes.  Pertinent labs & imaging results that were available during my care of the patient were reviewed by me and considered in my medical decision making (see chart for details).    Tizanidine prescribed for suspected muscle strain and muscle spasm pain.  Discussed that medication may cause drowsiness and use with caution.  Advised to start with half a dose to determine lowest effective dose and hopefully minimize side effects.  Encouraged follow-up if no gradual improvement with any worsening symptoms.  Final Clinical Impressions(s) / UC Diagnoses   Final diagnoses:  Trapezius strain, left, initial encounter   Discharge Instructions   None    ED  Prescriptions     Medication Sig Dispense Auth. Provider   tiZANidine (ZANAFLEX) 4 MG tablet Take 0.5-1 tablets (2-4 mg total) by mouth every 6 (six) hours as needed for muscle  spasms. 30 tablet Tomi Bamberger, PA-C      PDMP not reviewed this encounter.   Tomi Bamberger, PA-C 11/19/22 1214

## 2022-11-19 NOTE — ED Triage Notes (Signed)
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 "I go to PT often for this but now my neck is hurting a lot". "If I turn a certain way it hurts a lot". No injury known. "Just work related strains".

## 2022-12-19 ENCOUNTER — Ambulatory Visit: Payer: Medicare Other | Admitting: Cardiology

## 2023-01-08 DIAGNOSIS — E782 Mixed hyperlipidemia: Secondary | ICD-10-CM | POA: Insufficient documentation

## 2023-01-08 DIAGNOSIS — I10 Essential (primary) hypertension: Secondary | ICD-10-CM | POA: Insufficient documentation

## 2023-01-08 NOTE — Progress Notes (Deleted)
Cardiology Office Note:    Date:  01/08/2023  ID:  Theodore Wilson, DOB [birth year], MRN [id number] PCP: Renaye Rakers, MD  [facility] Providers Cardiologist:  None { Click to update primary MD,subspecialty MD or APP then REFRESH:1}    {Click to Open Review  :1}   Patient Profile:      Hypertension  TTE 11/27/19: EF 55-60, no RWMA, mild LVH, Gr 1 DD, low normal RVSF, NL PASP, mild MR, mod TR, RAP 3  TTE 04/18/22: mild LVH, no RWMA, EF 61, Gr 1 DD, mild TR, PASP 32  Hyperlipidemia       {      :1}   History of Present Illness:  Discussed the use of AI scribe software for clinical note transcription with the patient, who gave verbal consent to proceed.  Theodore Wilson is a 82 y.o. male who returns for follow up of hypertension. He was last seen by Dr. Melton Alar at [facility] in 05/2022.         ROS   See HPI ***    Studies Reviewed:       *** Results          Risk Assessment/Calculations:   {Does this patient have ATRIAL FIBRILLATION?:[phone]} No BP recorded.  {Refresh Note OR Click here to enter BP  :1}***       Physical Exam:   VS:  There were no vitals taken for this visit.   Wt Readings from Last 3 Encounters:  11/19/22 149 lb 0.5 oz (67.6 kg)  11/16/22 149 lb (67.6 kg)  06/16/22 146 lb (66.2 kg)    Physical Exam***     Assessment and Plan:   Assessment & Plan Essential hypertension  Mixed hyperlipidemia   Assessment and Plan             {      :1}    {Are you ordering a CV Procedure (e.g. stress test, cath, DCCV, TEE, etc)?   Press F2        :[id number]}  Dispo:  No follow-ups on file.  Signed, Tereso Newcomer, PA-C

## 2023-01-09 ENCOUNTER — Ambulatory Visit: Payer: Medicare Other | Admitting: Physician Assistant

## 2023-01-09 DIAGNOSIS — E782 Mixed hyperlipidemia: Secondary | ICD-10-CM

## 2023-01-09 DIAGNOSIS — I10 Essential (primary) hypertension: Secondary | ICD-10-CM

## 2023-02-01 ENCOUNTER — Encounter: Payer: Self-pay | Admitting: Cardiology

## 2023-02-01 ENCOUNTER — Ambulatory Visit: Payer: Medicare Other | Attending: Internal Medicine | Admitting: Cardiology

## 2023-02-01 VITALS — BP 110/60 | HR 68 | Ht 67.0 in | Wt 151.0 lb

## 2023-02-01 DIAGNOSIS — E782 Mixed hyperlipidemia: Secondary | ICD-10-CM | POA: Diagnosis not present

## 2023-02-01 DIAGNOSIS — I361 Nonrheumatic tricuspid (valve) insufficiency: Secondary | ICD-10-CM | POA: Diagnosis not present

## 2023-02-01 DIAGNOSIS — I10 Essential (primary) hypertension: Secondary | ICD-10-CM | POA: Diagnosis not present

## 2023-02-01 NOTE — Patient Instructions (Signed)
Medication Instructions:  Your physician has recommended you make the following change in your medication:   1) CHANGE atorvastatin (Lipitor) to 10 mg every day  *If you need a refill on your cardiac medications before your next appointment, please call your pharmacy*  Lab Work: None ordered today.  Testing/Procedures: None ordered today.  Follow-Up: At [facility], you and your health needs are our priority.  As part of our continuing mission to provide you with exceptional heart care, we have created designated Provider Care Teams.  These Care Teams include your primary Cardiologist (physician) and Advanced Practice Providers (APPs -  Physician Assistants and Nurse Practitioners) who all work together to provide you with the care you need, when you need it.  We recommend signing up for the patient portal called "MyChart".  Sign up information is provided on this After Visit Summary.  MyChart is used to connect with patients for Virtual Visits (Telemedicine).  Patients are able to view lab/test results, encounter notes, upcoming appointments, etc.  Non-urgent messages can be sent to your provider as well.   To learn more about what you can do with MyChart, go to [email or web address].    Your next appointment:   1 year(s)  The format for your next appointment:   In Person  Provider:   Tessa Lerner, DO {

## 2023-02-01 NOTE — Progress Notes (Signed)
Cardiology Office Note:   Date:  02/01/2023  ID:  CARA PAYMENT, DOB Jun 13, 1940, MRN [id number] PCP: Renaye Rakers, MD  [facility] Providers Cardiologist:  Tessa Lerner, DO    History of Present Illness:   Discussed the use of AI scribe software for clinical note transcription with the patient, who gave verbal consent to proceed.  History of Present Illness   The patient is an 82 year old individual with a history of hypertension, who had previously been seen by a cardiologist for preoperative evaluation. The patient underwent an echocardiogram, which showed normal left ventricular ejection fraction, normal left ventricular wall motion, grade one diastolic dysfunction with mild tricuspid regurgitation, and an estimated pulmonary artery systolic pressure of thirty-two. The patient reported feeling well and had no complaints of chest pain, shortness of breath, or irregular heartbeat.  The patient's blood pressure is well-controlled on current medication, with a reading of 110/60. However, the patient's LDL cholesterol was noted to be higher than desired 126 (ideally less than 100). The patient is only taking Atorvastatin 10mg  approximately once a week for the past year to year and a half.  The patient is otherwise active, engaging in regular physical activity. He participated in the [employer] Day parade this week. The patient reported no symptoms of dizziness, lightheadedness, or swelling in the legs.      Today patient denies chest pain, shortness of breath, lower extremity edema, fatigue, palpitations, melena, hematuria, hemoptysis, diaphoresis, weakness, presyncope, syncope, orthopnea, and PND.   Studies Reviewed:    Echocardiogram 04/18/2022: Left ventricle cavity is normal in size. Mild concentric hypertrophy of the left ventricle. Normal global wall motion. Normal LV systolic function with EF 61%. Doppler evidence of grade I (impaired) diastolic dysfunction, normal  LAP. Mild tricuspid regurgitation. Estimated pulmonary artery systolic pressure 32 mmHg.    Risk Assessment/Calculations:              Physical Exam:   VS:  BP 110/60 (BP Location: Left Arm, Patient Position: Sitting, Cuff Size: Normal)   Pulse 68   Ht 5\' 7"  (1.702 m)   Wt 151 lb (68.5 kg)   SpO2 98%   BMI 23.65 kg/m    Wt Readings from Last 3 Encounters:  02/01/23 151 lb (68.5 kg)  11/19/22 149 lb 0.5 oz (67.6 kg)  11/16/22 149 lb (67.6 kg)     Physical Exam Vitals reviewed.  Constitutional:      Appearance: Normal appearance.  HENT:     Head: Normocephalic.  Eyes:     Pupils: Pupils are equal, round, and reactive to light.  Cardiovascular:     Rate and Rhythm: Normal rate and regular rhythm.     Pulses: Normal pulses.     Heart sounds: Murmur (2/6 over apex) heard.  Pulmonary:     Effort: Pulmonary effort is normal.     Breath sounds: Normal breath sounds.  Musculoskeletal:     Right lower leg: No edema.     Left lower leg: No edema.  Skin:    General: Skin is warm and dry.     Capillary Refill: Capillary refill takes less than 2 seconds.  Neurological:     General: No focal deficit present.     Mental Status: He is alert and oriented to person, place, and time.  Psychiatric:        Mood and Affect: Mood normal.        Behavior: Behavior normal.        Thought Content:  Thought content normal.        Judgment: Judgment normal.    ASSESSMENT AND PLAN:     Assessment and Plan    Tricuspid Regurgitation Mild tricuspid regurgitation noted on echocardiogram with an estimated pulmonary artery systolic pressure of 32 mmHg. No symptoms such as chest pain, dyspnea, or arrhythmias reported. Physical exam reveals a faint murmur consistent with tricuspid regurgitation.  - Follow-up in one year unless symptoms develop  Hyperlipidemia Elevated LDL cholesterol 126 (ideal level less than 100). Currently takes atorvastatin 10 mg approximately once a week. Discussed  the importance of cholesterol management to prevent coronary artery disease and myocardial infarction. - Recommend atorvastatin 10 mg daily - Inform primary care provider of daily atorvastatin recommendation  Hypertension Hypertension well-controlled with amlodipine-benazepril 10 mg/20 mg. Blood pressure today is 110/60 mmHg. - Continue amlodipine-benazepril 10 mg/20 mg daily  General Health Maintenance Generally in good health with no new complaints. Emphasized the importance of regular health maintenance with primary care. - Continue regular follow-ups with primary care provider  Follow-up - Schedule follow-up appointment in one year.              Signed, Perlie Gold, PA-C

## 2023-03-07 ENCOUNTER — Other Ambulatory Visit: Payer: Self-pay | Admitting: Family Medicine

## 2023-03-07 ENCOUNTER — Ambulatory Visit
Admission: RE | Admit: 2023-03-07 | Discharge: 2023-03-07 | Disposition: A | Discharge status: Home / Self Care | Payer: Medicare Other | Source: Ambulatory Visit | Attending: Family Medicine | Admitting: Family Medicine

## 2023-03-07 DIAGNOSIS — R52 Pain, unspecified: Secondary | ICD-10-CM

## 2023-08-04 NOTE — Progress Notes (Signed)
   08/04/2023  Patient ID: Theodore Wilson, male   DOB: 1940/09/08, 83 y.o.   MRN: [id number]  Reviewed patient regarding medication adherence from a quality report for [facility]. The patient failed [facility] and MAC in 2024.     Per [facility] and payor portal fill history: Amlodipine-Benazepril 10-20 mg - last filled 07/19/23 for a 90-day supply.  Atorvastatin 10 mg - last filled 05/24/23 for an 84-day supply.    Patient has an upcoming PCP appointment on 08/08/23. LDL was elevated at last OV, patient wanted to try dietary modifications prior to atorvastatin dose increase.   Will follow-up post-OV to discuss.    Thank you for allowing pharmacy to be a part of this patient's care.    Livia Riffle, PharmD Clinical Pharmacist  [phone]

## 2023-12-07 ENCOUNTER — Encounter: Payer: Self-pay | Admitting: Emergency Medicine

## 2023-12-07 ENCOUNTER — Emergency Department (HOSPITAL_COMMUNITY)
Admission: EM | Admit: 2023-12-07 | Discharge: 2023-12-07 | Disposition: A | Discharge status: Home / Self Care | Attending: Emergency Medicine | Admitting: Emergency Medicine

## 2023-12-07 ENCOUNTER — Ambulatory Visit
Admission: EM | Admit: 2023-12-07 | Discharge: 2023-12-07 | Disposition: A | Discharge status: Home / Self Care | Source: Ambulatory Visit | Attending: Family Medicine | Admitting: Family Medicine

## 2023-12-07 ENCOUNTER — Other Ambulatory Visit: Payer: Self-pay

## 2023-12-07 ENCOUNTER — Emergency Department (HOSPITAL_COMMUNITY)

## 2023-12-07 DIAGNOSIS — R079 Chest pain, unspecified: Secondary | ICD-10-CM | POA: Insufficient documentation

## 2023-12-07 DIAGNOSIS — Z79899 Other long term (current) drug therapy: Secondary | ICD-10-CM | POA: Diagnosis not present

## 2023-12-07 DIAGNOSIS — I10 Essential (primary) hypertension: Secondary | ICD-10-CM | POA: Insufficient documentation

## 2023-12-07 DIAGNOSIS — R072 Precordial pain: Secondary | ICD-10-CM | POA: Diagnosis not present

## 2023-12-07 LAB — CBC
HCT: 44.1 % (ref 39.0–52.0)
Hemoglobin: 13.6 g/dL (ref 13.0–17.0)
MCH: 25.7 pg — ABNORMAL LOW (ref 26.0–34.0)
MCHC: 30.8 g/dL (ref 30.0–36.0)
MCV: 83.4 fL (ref 80.0–100.0)
Platelets: 150 K/uL (ref 150–400)
RBC: 5.29 MIL/uL (ref 4.22–5.81)
RDW: 13.9 % (ref 11.5–15.5)
WBC: 7.1 K/uL (ref 4.0–10.5)
nRBC: 0 % (ref 0.0–0.2)

## 2023-12-07 LAB — BASIC METABOLIC PANEL WITH GFR
Anion gap: 9 (ref 5–15)
BUN: 15 mg/dL (ref 8–23)
CO2: 26 mmol/L (ref 22–32)
Calcium: 9.2 mg/dL (ref 8.9–10.3)
Chloride: 105 mmol/L (ref 98–111)
Creatinine, Ser: 1.13 mg/dL (ref 0.61–1.24)
GFR, Estimated: >60 mL/min (ref >60)
Glucose, Bld: 78 mg/dL (ref 70–99)
Potassium: 3.9 mmol/L (ref 3.5–5.1)
Sodium: 140 mmol/L (ref 135–145)

## 2023-12-07 LAB — TROPONIN I (HIGH SENSITIVITY)
Troponin I (High Sensitivity): 57 ng/L — ABNORMAL HIGH (ref <18)
Troponin I (High Sensitivity): 59 ng/L — ABNORMAL HIGH (ref <18)

## 2023-12-07 NOTE — ED Provider Notes (Signed)
 [facility]    CSN: [id number] Arrival date & time: 12/07/23  1011      History   Chief Complaint Chief Complaint  Patient presents with   Chest Pain    HPI Theodore CALABRETTA is a 83 y.o. male.    Chest Pain Here for left anterior chest pain that began about 20 minutes ago.  He does not feel good and feels off.  No diaphoresis or vomiting or nausea.  He had some left shoulder pain when he was changing a light bulb when he was reaching overhead.  That is better but he still feels some pain in his left anterior chest.  He also had felt  A little tired in may be dizzy earlier today.  No fever or chills  Yesterday he had felt pretty good.  He does have a history of shoulder surgery earlier this year  He does have a family history of coronary artery disease and cerebrovascular disease.  NKDA   Past Medical History:  Diagnosis Date   Hypertension     Patient Active Problem List   Diagnosis Date Noted   Nonrheumatic tricuspid valve regurgitation 02/01/2023   Essential hypertension 01/08/2023   Mixed hyperlipidemia 01/08/2023    Past Surgical History:  Procedure Laterality Date   KNEE ARTHROSCOPY Right    SHOULDER ARTHROSCOPY Right        Home Medications    Prior to Admission medications   Medication Sig Start Date End Date Taking? Authorizing Provider  amLODipine-benazepril (LOTREL) 10-20 MG capsule Take 1 capsule by mouth daily. 01/18/22  Yes [provider]  atorvastatin (LIPITOR) 10 MG tablet Take 10 mg by mouth daily. 01/03/22  Yes [provider]  latanoprost (XALATAN) 0.005 % ophthalmic solution SMARTSIG:1 Drop(s) In Eye(s) Every Evening 12/20/21  Yes [provider]  sildenafil (REVATIO) 20 MG tablet SMARTSIG:2-5 Tablet(s) By Mouth Every 3 Days PRN 03/17/22  Yes [provider]  Testosterone  20.25 MG/ACT (1.62%) GEL SMARTSIG:1 pump Topical Every Morning 07/05/23  Yes [provider]  benzonatate   (TESSALON ) 100 MG capsule Take 1 capsule (100 mg total) by mouth every 8 (eight) hours as needed for cough. Patient not taking: Reported on 12/07/2023 09/04/22   Hazen Darryle BRAVO, FNP  celecoxib (CELEBREX) 200 MG capsule Take 200 mg by mouth 2 (two) times daily. Patient not taking: Reported on 12/07/2023 03/01/22   [provider]  tiZANidine  (ZANAFLEX ) 4 MG tablet Take 0.5-1 tablets (2-4 mg total) by mouth every 6 (six) hours as needed for muscle spasms. Patient not taking: Reported on 12/07/2023 11/19/22   Billy Asberry FALCON, PA-C  tobramycin-dexamethasone (TOBRADEX) ophthalmic solution Place 1 drop into the right eye 3 (three) times daily. Patient not taking: Reported on 12/07/2023 04/05/22   [provider]    Family History Family History  Problem Relation Age of Onset   Hypertension Mother    Heart disease Mother    Hypertension Father    Multiple sclerosis Sister    Stomach cancer Sister    COPD Brother    Hypertension Brother    Diabetes Brother     Social History Social History   Tobacco Use   Smoking status: Former    Current packs/day: 0.00    Average packs/day: 0.3 packs/day for 20.0 years (5.0 ttl pk-yrs)    Types: Cigarettes    Start date: 12/08/1963    Quit date: 12/08/1983    Years since quitting: 40.0    Passive exposure: Past  Smokeless tobacco: Never  Vaping Use   Vaping status: Never Used  Substance Use Topics   Alcohol use: Not Currently   Drug use: Never     Allergies   Patient has no known allergies.   Review of Systems Review of Systems  Cardiovascular:  Positive for chest pain.     Physical Exam Triage Vital Signs ED Triage Vitals [12/07/23 1028]  Encounter Vitals Group     BP 101/66     Girls Systolic BP Percentile      Girls Diastolic BP Percentile      Boys Systolic BP Percentile      Boys Diastolic BP Percentile      Pulse Rate 95     Resp 18     Temp 98 F (36.7 C)     Temp Source Oral     SpO2 97 %     Weight       Height      Head Circumference      Peak Flow      Pain Score 6     Pain Loc      Pain Education      Exclude from Growth Chart    No data found.  Updated Vital Signs BP 101/66 (BP Location: Left Arm)   Pulse 95   Temp 98 F (36.7 C) (Oral)   Resp 18   SpO2 97%   Visual Acuity Right Eye Distance:   Left Eye Distance:   Bilateral Distance:    Right Eye Near:   Left Eye Near:    Bilateral Near:     Physical Exam Vitals (His blood pressure is lower than usual for him compared to previous values.  He states he has not taken his medications yet today.) reviewed.  Constitutional:      General: He is not in acute distress.    Appearance: He is not ill-appearing, toxic-appearing or diaphoretic.     Comments: Pained facies is evident though mild.  HENT:     Nose: Nose normal.     Mouth/Throat:     Mouth: Mucous membranes are moist.     Pharynx: No oropharyngeal exudate or posterior oropharyngeal erythema.  Eyes:     Extraocular Movements: Extraocular movements intact.     Conjunctiva/sclera: Conjunctivae normal.     Pupils: Pupils are equal, round, and reactive to light.  Cardiovascular:     Rate and Rhythm: Normal rate and regular rhythm.     Heart sounds: No murmur heard. Pulmonary:     Effort: Pulmonary effort is normal.     Breath sounds: Normal breath sounds.  Musculoskeletal:     Cervical back: Neck supple.  Lymphadenopathy:     Cervical: No cervical adenopathy.  Skin:    Capillary Refill: Capillary refill takes less than 2 seconds.     Coloration: Skin is not jaundiced or pale.  Neurological:     General: No focal deficit present.     Mental Status: He is alert and oriented to person, place, and time.  Psychiatric:        Behavior: Behavior normal.      UC Treatments / Results  Labs (all labs ordered are listed, but only abnormal results are displayed) Labs Reviewed - No data to display  EKG   Radiology No results  found.  Procedures Procedures (including critical care time)  Medications Ordered in UC Medications - No data to display  Initial Impression / Assessment and Plan / UC Course  I have reviewed the triage vital signs and the nursing notes.  Pertinent labs & imaging results that were available during my care of the patient were reviewed by me and considered in my medical decision making (see chart for details).      There are some ST elevations in V 2, V3, V4 and V5.  Compared to an old EKG the upsloping ST elevations are probably evident in the old 1.  The elevations in V4 and V5 are new.  He will be sent to the emergency room by ambulance transport for further evaluation and treatment. Final Clinical Impressions(s) / UC Diagnoses   Final diagnoses:  None   Discharge Instructions   None    ED Prescriptions   None    PDMP not reviewed this encounter.   Vonna Sharlet POUR, MD 12/07/23 1046

## 2023-12-07 NOTE — Discharge Instructions (Signed)
 To the ER by ambulance

## 2023-12-07 NOTE — ED Provider Notes (Signed)
 [place] EMERGENCY DEPARTMENT AT [facility] Provider Note   CSN: [id number] Arrival date & time: 12/07/23  1137     Patient presents with: Chest Pain   Theodore Wilson is a 83 y.o. male.  With a history of hypertension and status post left shoulder arthroscopy who presents to the ED for chest pain.  Patient reports that he was reaching with both arms above his head earlier this morning attempting to get something off a shelf when he began to experience left shoulder pain.  He then had a tightness over his left chest.  He was seen in urgent care and sent here for further evaluation given concern for EKG changes.  No active chest pain.  Aspirin prior to arrival.  Denies shortness of breath nausea vomiting and other complaints at this time.    Chest Pain      Prior to Admission medications   Medication Sig Start Date End Date Taking? Authorizing Provider  Acetaminophen (TYLENOL PO) Take 2 tablets by mouth once as needed (pain).   Yes [provider]  amLODipine-benazepril (LOTREL) 10-20 MG capsule Take 1 capsule by mouth daily. 01/18/22  Yes [provider]  atorvastatin (LIPITOR) 10 MG tablet Take 10 mg by mouth every Saturday. 01/03/22  Yes [provider]  latanoprost (XALATAN) 0.005 % ophthalmic solution Place 1 drop into both eyes at bedtime. 12/20/21  Yes [provider]  OIL OF OREGANO PO Take 2 drops by mouth daily.   Yes [provider]  OVER THE COUNTER MEDICATION Take 1 tablet by mouth daily. OTC seaweed supplement   Yes [provider]  Testosterone  20.25 MG/ACT (1.62%) GEL Apply 2 Pump topically See admin instructions. Applu 1 pump to each axilla daily 07/05/23  Yes [provider]    Allergies: Patient has no known allergies.    Review of Systems  Cardiovascular:  Positive for chest pain.    Updated Vital Signs BP 112/67 (BP Location: Left Arm)   Pulse (!) 56   Temp 98 F (36.7 C) (Oral)   Ht  5' 7 (1.702 m)   Wt 67.1 kg   SpO2 100%   BMI 23.18 kg/m   Physical Exam Vitals and nursing note reviewed.  HENT:     Head: Normocephalic and atraumatic.  Eyes:     Pupils: Pupils are equal, round, and reactive to light.  Cardiovascular:     Rate and Rhythm: Normal rate and regular rhythm.  Pulmonary:     Effort: Pulmonary effort is normal.     Breath sounds: Normal breath sounds.  Abdominal:     Palpations: Abdomen is soft.     Tenderness: There is no abdominal tenderness.  Musculoskeletal:     Comments: Full active range of motion with ranging left shoulder No anterior chest wall tenderness  Skin:    General: Skin is warm and dry.  Neurological:     Mental Status: He is alert.  Psychiatric:        Mood and Affect: Mood normal.     (all labs ordered are listed, but only abnormal results are displayed) Labs Reviewed  CBC - Abnormal; Notable for the following components:      Result Value   MCH 25.7 (*)    All other components within normal limits  TROPONIN I (HIGH SENSITIVITY) - Abnormal; Notable for the following components:   Troponin I (High Sensitivity) 57 (*)    All other components within normal limits  TROPONIN I (  HIGH SENSITIVITY) - Abnormal; Notable for the following components:   Troponin I (High Sensitivity) 59 (*)    All other components within normal limits  BASIC METABOLIC PANEL WITH GFR    EKG: EKG Interpretation Date/Time:  Thursday December 07 2023 13:05:31 EDT Ventricular Rate:  50 PR Interval:  146 QRS Duration:  90 QT Interval:  448 QTC Calculation: 408 R Axis:   -13  Text Interpretation: Sinus bradycardia Minimal voltage criteria for LVH, may be normal variant ( R in aVL ) Early repolarization Abnormal ECG When compared with ECG of 07-Dec-2023 10:38, PREVIOUS ECG IS PRESENT Confirmed by Pamella Sharper [id number]) on 12/07/2023 1:25:50 PM  Radiology: [facility] Chest 2 View Result Date: 12/07/2023 CLINICAL DATA:  Chest pain. EXAM: CHEST - 2 VIEW  COMPARISON:  None Available. FINDINGS: The heart size and mediastinal contours are within normal limits. Aortic atherosclerosis. No pulmonary edema, focal consolidation, pleural effusion, or pneumothorax. Left reverse shoulder arthroplasty. No acute osseous abnormality. IMPRESSION: 1. No acute cardiopulmonary findings. 2.  Aortic Atherosclerosis (ICD10-I70.0). Electronically Signed   By: Harrietta Sherry M.D.   On: 12/07/2023 12:58     Procedures   Medications Ordered in the ED - No data to display  Clinical Course as of 12/07/23 1549  Thu Dec 07, 2023  1548 Initial troponin of 57 with delta of 59.  Not consistent with ACS.  EKG here looks the same as earlier this morning and actually looks the same as 2012.  No active chest pain.  Patient requesting discharge.  Low suspicion for ACS.  Most likely musculoskeletal in etiology and appropriate for discharge with PCP follow-up at this time.  Return precautions were discussed in detail. [MP]    Clinical Course User Index [MP] Pamella Sharper LABOR, DO                                 Medical Decision Making This is a very healthy 83 year old male presenting for chest pain.  Symptoms started after reaching up above his head and experiencing left shoulder pain.  Left chest tightness.  EKG redemonstrates LVH.  No active chest pain.  Low suspicion for ACS but will obtain cardiac workup including high sensitive troponin repeat EKG and basic laboratory workup.  Most likely etiology would be musculoskeletal given his history of shoulder discomfort and potential for inciting injury this morning.  Amount and/or Complexity of Data Reviewed Labs: ordered. Radiology: ordered.        Final diagnoses:  Chest pain, unspecified type    ED Discharge Orders     None          Pamella Sharper LABOR, DO 12/07/23 1549

## 2023-12-07 NOTE — Discharge Instructions (Signed)
 You were seen in the emergency department for chest pain Your blood work EKG and chest x-ray all looked okay It is important that you follow-up with your primary doctor and continue taking previous prescribed medications Return to the emergency room for chest pain trouble breathing or any other concerns

## 2023-12-07 NOTE — ED Notes (Signed)
 Patient is being discharged from the Urgent Care and sent to the Emergency Department via Ambulance (called EMS @ 1044) . Per Provider, patient is in need of higher level of care due to Chest Pain/EKG Changes (in reviewing previous). Patient is aware and verbalizes understanding of plan of care.  Vitals:   12/07/23 1028  BP: 101/66  Pulse: 95  Resp: 18  Temp: 98 F (36.7 C)  SpO2: 97%

## 2023-12-07 NOTE — ED Triage Notes (Signed)
 Pt. Brought in via [facility] from Urgent Care with complaints of chest pain; Per [facility]; the pt. Showed a STEMI elevation but EDP looked over it and did not activate it; Pt. Received Asiprin on the way here; Pt. Reports the chest pain is gone now; No SOB or N/V

## 2023-12-07 NOTE — ED Triage Notes (Addendum)
 Pt reports L-sided chest pain (described as tightness) that started 15-105mins ago. Pt notes he started having L arm pain earlier this morning that started after he was reaching up and changing lightbulbs at home. Denies SOB, palpitations, and dizziness. Notes weakness and fatigue x1 week. No recent cold symptoms.  Prior smoking hx, HTN, and HLD for pt. Reports two brothers had strokes and mother had heart problems - specifics unknown. BP is 101/66 and pt has not taken his bp medication this morning. States it is normally not this low.

## 2024-01-19 ENCOUNTER — Encounter: Payer: Self-pay | Admitting: Emergency Medicine

## 2024-01-19 ENCOUNTER — Ambulatory Visit: Admission: EM | Admit: 2024-01-19 | Discharge: 2024-01-19 | Disposition: A | Discharge status: Home / Self Care

## 2024-01-19 DIAGNOSIS — H6123 Impacted cerumen, bilateral: Secondary | ICD-10-CM | POA: Diagnosis not present

## 2024-01-19 NOTE — ED Triage Notes (Addendum)
 Pt presents c/o R ear fullness x 5 days. Pt states,  I went to have an ear exam and they told me it was a lot of wax in there so I needed to come get it out.  Pt denies pain.

## 2024-01-19 NOTE — ED Provider Notes (Signed)
 [facility]    CSN: [id number] Arrival date & time: 01/19/24  1407      History   Chief Complaint Chief Complaint  Patient presents with   Ear Fullness    R ear     HPI Theodore Wilson is a 83 y.o. male.   Patient presents today for cerumen removal after being told by audiologist that he has cerumen impaction.  Patient denies loss of hearing.  The history is provided by the patient.  Ear Fullness    Past Medical History:  Diagnosis Date   Hypertension     Patient Active Problem List   Diagnosis Date Noted   Nonrheumatic tricuspid valve regurgitation 02/01/2023   Essential hypertension 01/08/2023   Mixed hyperlipidemia 01/08/2023    Past Surgical History:  Procedure Laterality Date   KNEE ARTHROSCOPY Right    SHOULDER ARTHROSCOPY Right        Home Medications    Prior to Admission medications   Medication Sig Start Date End Date Taking? Authorizing Provider  Acetaminophen (TYLENOL PO) Take 2 tablets by mouth once as needed (pain).    [provider]  amLODipine-benazepril (LOTREL) 10-20 MG capsule Take 1 capsule by mouth daily. 01/18/22   [provider]  atorvastatin (LIPITOR) 10 MG tablet Take 10 mg by mouth every Saturday. 01/03/22   [provider]  latanoprost (XALATAN) 0.005 % ophthalmic solution Place 1 drop into both eyes at bedtime. 12/20/21   [provider]  OIL OF OREGANO PO Take 2 drops by mouth daily.    [provider]  OVER THE COUNTER MEDICATION Take 1 tablet by mouth daily. OTC seaweed supplement    [provider]  Testosterone  20.25 MG/ACT (1.62%) GEL Apply 2 Pump topically See admin instructions. Applu 1 pump to each axilla daily 07/05/23   [provider]    Family History Family History  Problem Relation Age of Onset   Hypertension Mother    Heart disease Mother    Hypertension Father    Multiple sclerosis Sister    Stomach cancer Sister    COPD Brother     Hypertension Brother    Diabetes Brother     Social History Social History   Tobacco Use   Smoking status: Former    Current packs/day: 0.00    Average packs/day: 0.3 packs/day for 20.0 years (5.0 ttl pk-yrs)    Types: Cigarettes    Start date: 12/08/1963    Quit date: 12/08/1983    Years since quitting: 40.1    Passive exposure: Past   Smokeless tobacco: Never  Vaping Use   Vaping status: Never Used  Substance Use Topics   Alcohol use: Not Currently   Drug use: Never     Allergies   Patient has no known allergies.   Review of Systems Review of Systems   Physical Exam Triage Vital Signs ED Triage Vitals  Encounter Vitals Group     BP 01/19/24 1431 (!) 156/80     Girls Systolic BP Percentile --      Girls Diastolic BP Percentile --      Boys Systolic BP Percentile --      Boys Diastolic BP Percentile --      Pulse Rate 01/19/24 1431 (!) 56     Resp 01/19/24 1431 (!) 98     Temp 01/19/24 1431 97.9 F (36.6 C)     Temp Source 01/19/24 1431 Oral     SpO2 01/19/24 1431 98 %  Weight 01/19/24 1430 147 lb 14.9 oz (67.1 kg)     Height --      Head Circumference --      Peak Flow --      Pain Score 01/19/24 1429 0     Pain Loc --      Pain Education --      Exclude from Growth Chart --    No data found.  Updated Vital Signs BP (!) 156/80 (BP Location: Left Arm)   Pulse (!) 56   Temp 97.9 F (36.6 C) (Oral)   Resp (!) 98   Wt 147 lb 14.9 oz (67.1 kg)   SpO2 98%   BMI 23.17 kg/m   Visual Acuity Right Eye Distance:   Left Eye Distance:   Bilateral Distance:    Right Eye Near:   Left Eye Near:    Bilateral Near:     Physical Exam Vitals and nursing note reviewed.  Constitutional:      General: He is not in acute distress.    Appearance: Normal appearance. He is not ill-appearing, toxic-appearing or diaphoretic.  HENT:     Right Ear: There is impacted cerumen.     Left Ear: There is impacted cerumen.  Eyes:     General: No scleral  icterus. Cardiovascular:     Rate and Rhythm: Normal rate and regular rhythm.     Heart sounds: Normal heart sounds.  Pulmonary:     Effort: Pulmonary effort is normal. No respiratory distress.     Breath sounds: Normal breath sounds. No wheezing or rhonchi.  Skin:    General: Skin is warm.  Neurological:     Mental Status: He is alert and oriented to person, place, and time.  Psychiatric:        Mood and Affect: Mood normal.        Behavior: Behavior normal.      UC Treatments / Results  Labs (all labs ordered are listed, but only abnormal results are displayed) Labs Reviewed - No data to display  EKG   Radiology No results found.  Procedures Procedures (including critical care time)  Medications Ordered in UC Medications - No data to display  Initial Impression / Assessment and Plan / UC Course  I have reviewed the triage vital signs and the nursing notes.  Pertinent labs & imaging results that were available during my care of the patient were reviewed by me and considered in my medical decision making (see chart for details).     Bilateral TMs intact after ear lavage, patient experienced relief. Final Clinical Impressions(s) / UC Diagnoses   Final diagnoses:  Bilateral impacted cerumen   Discharge Instructions   None    ED Prescriptions   None    PDMP not reviewed this encounter.   Andra Corean BROCKS, PA-C 01/19/24 [phone]
